# Patient Record
Sex: Female | Born: 1970 | Race: White | Hispanic: No | Marital: Single | Smoking: Never smoker
Health system: Southern US, Community
[De-identification: ages and names within clinical notes are randomized; demographics above are authoritative.]

## PROBLEM LIST (undated history)

## (undated) DIAGNOSIS — R112 Nausea with vomiting, unspecified: Secondary | ICD-10-CM

## (undated) DIAGNOSIS — Z9889 Other specified postprocedural states: Secondary | ICD-10-CM

## (undated) DIAGNOSIS — R519 Headache, unspecified: Secondary | ICD-10-CM

## (undated) DIAGNOSIS — T8859XA Other complications of anesthesia, initial encounter: Secondary | ICD-10-CM

## (undated) DIAGNOSIS — F410 Panic disorder [episodic paroxysmal anxiety] without agoraphobia: Secondary | ICD-10-CM

## (undated) DIAGNOSIS — F419 Anxiety disorder, unspecified: Secondary | ICD-10-CM

## (undated) DIAGNOSIS — T4145XA Adverse effect of unspecified anesthetic, initial encounter: Secondary | ICD-10-CM

## (undated) DIAGNOSIS — K219 Gastro-esophageal reflux disease without esophagitis: Secondary | ICD-10-CM

## (undated) DIAGNOSIS — D649 Anemia, unspecified: Secondary | ICD-10-CM

## (undated) DIAGNOSIS — R51 Headache: Secondary | ICD-10-CM

## (undated) HISTORY — PX: RIGHT OOPHORECTOMY: SHX2359

## (undated) HISTORY — DX: Anxiety disorder, unspecified: F41.9

## (undated) HISTORY — DX: Panic disorder (episodic paroxysmal anxiety): F41.0

## (undated) HISTORY — DX: Anemia, unspecified: D64.9

---

## 1997-12-23 ENCOUNTER — Encounter: Admission: RE | Admit: 1997-12-23 | Discharge: 1998-03-23 | Payer: Self-pay | Admitting: Obstetrics and Gynecology

## 2002-10-08 ENCOUNTER — Encounter: Payer: Self-pay | Admitting: Obstetrics and Gynecology

## 2002-10-08 ENCOUNTER — Ambulatory Visit (HOSPITAL_COMMUNITY): Admission: RE | Admit: 2002-10-08 | Discharge: 2002-10-08 | Payer: Self-pay | Admitting: Obstetrics and Gynecology

## 2003-02-05 ENCOUNTER — Encounter (INDEPENDENT_AMBULATORY_CARE_PROVIDER_SITE_OTHER): Payer: Self-pay

## 2003-02-05 ENCOUNTER — Inpatient Hospital Stay (HOSPITAL_COMMUNITY): Admission: RE | Admit: 2003-02-05 | Discharge: 2003-02-08 | Payer: Self-pay | Admitting: Obstetrics and Gynecology

## 2003-04-15 ENCOUNTER — Ambulatory Visit (HOSPITAL_COMMUNITY): Admission: RE | Admit: 2003-04-15 | Discharge: 2003-04-15 | Payer: Self-pay | Admitting: Obstetrics and Gynecology

## 2003-04-15 ENCOUNTER — Encounter: Payer: Self-pay | Admitting: Obstetrics and Gynecology

## 2007-12-04 ENCOUNTER — Emergency Department: Payer: Self-pay | Admitting: Emergency Medicine

## 2007-12-16 ENCOUNTER — Emergency Department: Payer: Self-pay | Admitting: Emergency Medicine

## 2007-12-16 ENCOUNTER — Other Ambulatory Visit: Payer: Self-pay

## 2008-08-18 ENCOUNTER — Emergency Department: Payer: Self-pay | Admitting: Emergency Medicine

## 2008-11-04 ENCOUNTER — Emergency Department: Payer: Self-pay | Admitting: Emergency Medicine

## 2008-12-12 ENCOUNTER — Emergency Department: Payer: Self-pay | Admitting: Emergency Medicine

## 2009-04-24 ENCOUNTER — Emergency Department: Payer: Self-pay | Admitting: Internal Medicine

## 2010-09-15 ENCOUNTER — Observation Stay: Payer: Self-pay | Admitting: Cardiovascular Disease

## 2010-09-18 ENCOUNTER — Ambulatory Visit: Payer: Self-pay | Admitting: Cardiovascular Disease

## 2010-09-22 ENCOUNTER — Ambulatory Visit: Payer: Self-pay | Admitting: Emergency Medicine

## 2010-09-29 ENCOUNTER — Ambulatory Visit: Payer: Self-pay | Admitting: Oncology

## 2010-10-01 ENCOUNTER — Other Ambulatory Visit: Payer: Self-pay | Admitting: Diagnostic Radiology

## 2010-10-01 ENCOUNTER — Ambulatory Visit: Payer: Self-pay | Admitting: Emergency Medicine

## 2010-10-06 ENCOUNTER — Encounter (HOSPITAL_COMMUNITY)
Admission: RE | Admit: 2010-10-06 | Discharge: 2010-11-20 | Payer: Self-pay | Source: Home / Self Care | Attending: Oncology | Admitting: Oncology

## 2010-10-06 LAB — CBC WITH DIFFERENTIAL/PLATELET
BASO%: 0.5 % (ref 0.0–2.0)
Basophils Absolute: 0 10*3/uL (ref 0.0–0.1)
EOS%: 4.6 % (ref 0.0–7.0)
Eosinophils Absolute: 0.2 10*3/uL (ref 0.0–0.5)
HCT: 21.7 % — ABNORMAL LOW (ref 34.8–46.6)
HGB: 7.6 g/dL — ABNORMAL LOW (ref 11.6–15.9)
LYMPH%: 33.5 % (ref 14.0–49.7)
MCH: 40 pg — ABNORMAL HIGH (ref 25.1–34.0)
MCHC: 35 g/dL (ref 31.5–36.0)
MCV: 114.4 fL — ABNORMAL HIGH (ref 79.5–101.0)
MONO#: 0.1 10*3/uL (ref 0.1–0.9)
MONO%: 2.6 % (ref 0.0–14.0)
NEUT#: 2.5 10*3/uL (ref 1.5–6.5)
NEUT%: 58.8 % (ref 38.4–76.8)
Platelets: 233 10*3/uL (ref 145–400)
RBC: 1.9 10*6/uL — ABNORMAL LOW (ref 3.70–5.45)
RDW: 28.7 % — ABNORMAL HIGH (ref 11.2–14.5)
WBC: 4.2 10*3/uL (ref 3.9–10.3)
lymph#: 1.4 10*3/uL (ref 0.9–3.3)

## 2010-10-06 LAB — CHCC SMEAR

## 2010-10-06 LAB — TYPE & CROSSMATCH - CHCC

## 2010-10-07 LAB — DIRECT ANTIGLOBULIN TEST (NOT AT ARMC)
DAT (Complement): NEGATIVE
DAT IgG: NEGATIVE

## 2010-10-08 ENCOUNTER — Ambulatory Visit: Payer: Self-pay | Admitting: Emergency Medicine

## 2010-10-09 LAB — COMPREHENSIVE METABOLIC PANEL
AST: 10 U/L (ref 0–37)
Alkaline Phosphatase: 57 U/L (ref 39–117)
BUN: 7 mg/dL (ref 6–23)
Creatinine, Ser: 0.54 mg/dL (ref 0.40–1.20)
Glucose, Bld: 93 mg/dL (ref 70–99)
Total Bilirubin: 1.2 mg/dL (ref 0.3–1.2)

## 2010-10-09 LAB — VITAMIN B12: Vitamin B-12: 46 pg/mL — ABNORMAL LOW (ref 211–911)

## 2010-10-09 LAB — SPEP & IFE WITH QIG
Albumin ELP: 57.3 % (ref 55.8–66.1)
Alpha-1-Globulin: 4.8 % (ref 2.9–4.9)
Alpha-2-Globulin: 6.8 % — ABNORMAL LOW (ref 7.1–11.8)
Beta Globulin: 5.5 % (ref 4.7–7.2)
IgA: 342 mg/dL (ref 68–378)
Total Protein, Serum Electrophoresis: 7 g/dL (ref 6.0–8.3)

## 2010-10-09 LAB — IRON AND TIBC
Iron: 147 ug/dL — ABNORMAL HIGH (ref 42–145)
TIBC: 270 ug/dL (ref 250–470)
UIBC: 123 ug/dL

## 2010-10-09 LAB — FOLATE: Folate: >20 ng/mL

## 2010-10-09 LAB — ERYTHROPOIETIN: Erythropoietin: 83.9 m[IU]/mL — ABNORMAL HIGH (ref 2.6–34.0)

## 2010-10-23 LAB — CBC WITH DIFFERENTIAL/PLATELET
BASO%: 0.5 % (ref 0.0–2.0)
Basophils Absolute: 0 10*3/uL (ref 0.0–0.1)
EOS%: 5.9 % (ref 0.0–7.0)
Eosinophils Absolute: 0.3 10*3/uL (ref 0.0–0.5)
HCT: 30.9 % — ABNORMAL LOW (ref 34.8–46.6)
HGB: 10.5 g/dL — ABNORMAL LOW (ref 11.6–15.9)
LYMPH%: 31.6 % (ref 14.0–49.7)
MCH: 35.4 pg — ABNORMAL HIGH (ref 25.1–34.0)
MCHC: 34.1 g/dL (ref 31.5–36.0)
MCV: 103.7 fL — ABNORMAL HIGH (ref 79.5–101.0)
MONO#: 0.5 10*3/uL (ref 0.1–0.9)
MONO%: 8.5 % (ref 0.0–14.0)
NEUT#: 2.9 10*3/uL (ref 1.5–6.5)
NEUT%: 53.5 % (ref 38.4–76.8)
Platelets: 340 10*3/uL (ref 145–400)
RBC: 2.98 10*6/uL — ABNORMAL LOW (ref 3.70–5.45)
RDW: 25.1 % — ABNORMAL HIGH (ref 11.2–14.5)
WBC: 5.3 10*3/uL (ref 3.9–10.3)
lymph#: 1.7 10*3/uL (ref 0.9–3.3)

## 2010-10-26 ENCOUNTER — Ambulatory Visit (HOSPITAL_COMMUNITY)
Admission: RE | Admit: 2010-10-26 | Discharge: 2010-10-26 | Payer: Self-pay | Source: Home / Self Care | Admitting: Oncology

## 2010-11-09 ENCOUNTER — Ambulatory Visit: Payer: Self-pay | Admitting: Oncology

## 2010-11-10 LAB — CBC WITH DIFFERENTIAL/PLATELET
BASO%: 2.1 % — ABNORMAL HIGH (ref 0.0–2.0)
Basophils Absolute: 0.1 10*3/uL (ref 0.0–0.1)
EOS%: 3.6 % (ref 0.0–7.0)
Eosinophils Absolute: 0.1 10*3/uL (ref 0.0–0.5)
HCT: 33.8 % — ABNORMAL LOW (ref 34.8–46.6)
HGB: 11.9 g/dL (ref 11.6–15.9)
LYMPH%: 24.7 % (ref 14.0–49.7)
MCH: 33.9 pg (ref 25.1–34.0)
MCHC: 35.1 g/dL (ref 31.5–36.0)
MCV: 96.6 fL (ref 79.5–101.0)
MONO#: 0.3 10*3/uL (ref 0.1–0.9)
MONO%: 8.7 % (ref 0.0–14.0)
NEUT#: 2.4 10*3/uL (ref 1.5–6.5)
NEUT%: 60.9 % (ref 38.4–76.8)
Platelets: 211 10*3/uL (ref 145–400)
RBC: 3.5 10*6/uL — ABNORMAL LOW (ref 3.70–5.45)
RDW: 14.9 % — ABNORMAL HIGH (ref 11.2–14.5)
WBC: 3.9 10*3/uL (ref 3.9–10.3)
lymph#: 1 10*3/uL (ref 0.9–3.3)

## 2010-12-08 LAB — CBC WITH DIFFERENTIAL/PLATELET
BASO%: 1.7 % (ref 0.0–2.0)
Basophils Absolute: 0.1 10*3/uL (ref 0.0–0.1)
EOS%: 2.3 % (ref 0.0–7.0)
Eosinophils Absolute: 0.1 10*3/uL (ref 0.0–0.5)
HCT: 39.7 % (ref 34.8–46.6)
HGB: 13.5 g/dL (ref 11.6–15.9)
LYMPH%: 26.3 % (ref 14.0–49.7)
MCH: 30.9 pg (ref 25.1–34.0)
MCHC: 34 g/dL (ref 31.5–36.0)
MCV: 90.7 fL (ref 79.5–101.0)
MONO#: 0.4 10*3/uL (ref 0.1–0.9)
MONO%: 8 % (ref 0.0–14.0)
NEUT#: 3.4 10*3/uL (ref 1.5–6.5)
NEUT%: 61.7 % (ref 38.4–76.8)
Platelets: 267 10*3/uL (ref 145–400)
RBC: 4.37 10*6/uL (ref 3.70–5.45)
RDW: 12.6 % (ref 11.2–14.5)
WBC: 5.5 10*3/uL (ref 3.9–10.3)
lymph#: 1.4 10*3/uL (ref 0.9–3.3)

## 2011-01-05 ENCOUNTER — Encounter (HOSPITAL_BASED_OUTPATIENT_CLINIC_OR_DEPARTMENT_OTHER): Payer: Medicaid Other | Admitting: Oncology

## 2011-01-05 ENCOUNTER — Other Ambulatory Visit: Payer: Self-pay | Admitting: Oncology

## 2011-01-05 DIAGNOSIS — E119 Type 2 diabetes mellitus without complications: Secondary | ICD-10-CM

## 2011-01-05 DIAGNOSIS — D649 Anemia, unspecified: Secondary | ICD-10-CM

## 2011-01-05 DIAGNOSIS — E538 Deficiency of other specified B group vitamins: Secondary | ICD-10-CM

## 2011-01-05 DIAGNOSIS — I10 Essential (primary) hypertension: Secondary | ICD-10-CM

## 2011-01-05 DIAGNOSIS — I2510 Atherosclerotic heart disease of native coronary artery without angina pectoris: Secondary | ICD-10-CM

## 2011-01-05 LAB — COMPREHENSIVE METABOLIC PANEL
ALT: 8 U/L (ref 0–35)
AST: 9 U/L (ref 0–37)
Albumin: 4.1 g/dL (ref 3.5–5.2)
Alkaline Phosphatase: 78 U/L (ref 39–117)
BUN: 8 mg/dL (ref 6–23)
CO2: 23 mEq/L (ref 19–32)
Calcium: 8.8 mg/dL (ref 8.4–10.5)
Chloride: 104 mEq/L (ref 96–112)
Creatinine, Ser: 0.55 mg/dL (ref 0.40–1.20)
Glucose, Bld: 84 mg/dL (ref 70–99)
Potassium: 4 mEq/L (ref 3.5–5.3)
Sodium: 137 mEq/L (ref 135–145)
Total Bilirubin: 0.5 mg/dL (ref 0.3–1.2)
Total Protein: 7.2 g/dL (ref 6.0–8.3)

## 2011-01-05 LAB — CBC WITH DIFFERENTIAL/PLATELET
BASO%: 0.8 % (ref 0.0–2.0)
Basophils Absolute: 0 10*3/uL (ref 0.0–0.1)
EOS%: 2.8 % (ref 0.0–7.0)
Eosinophils Absolute: 0.2 10*3/uL (ref 0.0–0.5)
HCT: 37.3 % (ref 34.8–46.6)
HGB: 12.4 g/dL (ref 11.6–15.9)
LYMPH%: 21.9 % (ref 14.0–49.7)
MCH: 29.2 pg (ref 25.1–34.0)
MCHC: 33.4 g/dL (ref 31.5–36.0)
MCV: 87.4 fL (ref 79.5–101.0)
MONO#: 0.3 10*3/uL (ref 0.1–0.9)
MONO%: 6.5 % (ref 0.0–14.0)
NEUT#: 3.7 10*3/uL (ref 1.5–6.5)
NEUT%: 68 % (ref 38.4–76.8)
Platelets: 281 10*3/uL (ref 145–400)
RBC: 4.26 10*6/uL (ref 3.70–5.45)
RDW: 11.9 % (ref 11.2–14.5)
WBC: 5.4 10*3/uL (ref 3.9–10.3)
lymph#: 1.2 10*3/uL (ref 0.9–3.3)

## 2011-01-05 LAB — VITAMIN B12: Vitamin B-12: 225 pg/mL (ref 211–911)

## 2011-02-02 LAB — CROSSMATCH
Antibody Screen: NEGATIVE
Unit division: 0

## 2011-02-02 LAB — CBC
HCT: 33.6 % — ABNORMAL LOW (ref 36.0–46.0)
Hemoglobin: 11.6 g/dL — ABNORMAL LOW (ref 12.0–15.0)
MCV: 100.3 fL — ABNORMAL HIGH (ref 78.0–100.0)
Platelets: 317 10*3/uL (ref 150–400)
RBC: 3.35 MIL/uL — ABNORMAL LOW (ref 3.87–5.11)
WBC: 4.2 10*3/uL (ref 4.0–10.5)

## 2011-02-02 LAB — DIFFERENTIAL
Eosinophils Relative: 8 % — ABNORMAL HIGH (ref 0–5)
Lymphocytes Relative: 31 % (ref 12–46)
Lymphs Abs: 1.3 10*3/uL (ref 0.7–4.0)
Monocytes Relative: 13 % — ABNORMAL HIGH (ref 3–12)

## 2011-02-02 LAB — BONE MARROW EXAM

## 2011-02-03 ENCOUNTER — Other Ambulatory Visit: Payer: Self-pay | Admitting: Oncology

## 2011-02-03 ENCOUNTER — Encounter (HOSPITAL_BASED_OUTPATIENT_CLINIC_OR_DEPARTMENT_OTHER): Payer: Medicaid Other | Admitting: Oncology

## 2011-02-03 DIAGNOSIS — I10 Essential (primary) hypertension: Secondary | ICD-10-CM

## 2011-02-03 DIAGNOSIS — E538 Deficiency of other specified B group vitamins: Secondary | ICD-10-CM

## 2011-02-03 DIAGNOSIS — D649 Anemia, unspecified: Secondary | ICD-10-CM

## 2011-02-03 DIAGNOSIS — D539 Nutritional anemia, unspecified: Secondary | ICD-10-CM

## 2011-02-03 DIAGNOSIS — I2510 Atherosclerotic heart disease of native coronary artery without angina pectoris: Secondary | ICD-10-CM

## 2011-02-03 DIAGNOSIS — E119 Type 2 diabetes mellitus without complications: Secondary | ICD-10-CM

## 2011-02-03 LAB — CBC WITH DIFFERENTIAL/PLATELET
BASO%: 1 % (ref 0.0–2.0)
EOS%: 0.5 % (ref 0.0–7.0)
LYMPH%: 18.6 % (ref 14.0–49.7)
MCHC: 34 g/dL (ref 31.5–36.0)
MCV: 83.4 fL (ref 79.5–101.0)
MONO%: 6.6 % (ref 0.0–14.0)
Platelets: 364 10*3/uL (ref 145–400)
RBC: 4.06 10*6/uL (ref 3.70–5.45)
WBC: 4.9 10*3/uL (ref 3.9–10.3)

## 2011-02-03 LAB — COMPREHENSIVE METABOLIC PANEL
Albumin: 3.4 g/dL — ABNORMAL LOW (ref 3.5–5.2)
Alkaline Phosphatase: 74 U/L (ref 39–117)
Calcium: 8.8 mg/dL (ref 8.4–10.5)
Sodium: 138 mEq/L (ref 135–145)
Total Bilirubin: 0.6 mg/dL (ref 0.3–1.2)

## 2011-03-02 ENCOUNTER — Encounter (HOSPITAL_BASED_OUTPATIENT_CLINIC_OR_DEPARTMENT_OTHER): Payer: Medicaid Other | Admitting: Oncology

## 2011-03-02 DIAGNOSIS — E538 Deficiency of other specified B group vitamins: Secondary | ICD-10-CM

## 2011-03-30 ENCOUNTER — Other Ambulatory Visit: Payer: Self-pay | Admitting: Medical

## 2011-03-30 ENCOUNTER — Encounter (HOSPITAL_BASED_OUTPATIENT_CLINIC_OR_DEPARTMENT_OTHER): Payer: Medicaid Other | Admitting: Oncology

## 2011-03-30 DIAGNOSIS — I10 Essential (primary) hypertension: Secondary | ICD-10-CM

## 2011-03-30 DIAGNOSIS — I2510 Atherosclerotic heart disease of native coronary artery without angina pectoris: Secondary | ICD-10-CM

## 2011-03-30 DIAGNOSIS — D539 Nutritional anemia, unspecified: Secondary | ICD-10-CM

## 2011-03-30 DIAGNOSIS — D649 Anemia, unspecified: Secondary | ICD-10-CM

## 2011-03-30 DIAGNOSIS — E538 Deficiency of other specified B group vitamins: Secondary | ICD-10-CM

## 2011-03-30 LAB — COMPREHENSIVE METABOLIC PANEL
ALT: 21 U/L (ref 0–35)
AST: 23 U/L (ref 0–37)
Albumin: 4.2 g/dL (ref 3.5–5.2)
BUN: 10 mg/dL (ref 6–23)
CO2: 21 mEq/L (ref 19–32)
Calcium: 8.8 mg/dL (ref 8.4–10.5)
Chloride: 105 mEq/L (ref 96–112)
Potassium: 3.7 mEq/L (ref 3.5–5.3)

## 2011-03-30 LAB — CBC WITH DIFFERENTIAL/PLATELET
BASO%: 0.7 % (ref 0.0–2.0)
Basophils Absolute: 0 10*3/uL (ref 0.0–0.1)
EOS%: 3 % (ref 0.0–7.0)
HCT: 34.1 % — ABNORMAL LOW (ref 34.8–46.6)
HGB: 11.2 g/dL — ABNORMAL LOW (ref 11.6–15.9)
MCH: 27 pg (ref 25.1–34.0)
MONO#: 0.3 10*3/uL (ref 0.1–0.9)
NEUT#: 3 10*3/uL (ref 1.5–6.5)
RDW: 12.7 % (ref 11.2–14.5)
WBC: 5 10*3/uL (ref 3.9–10.3)
lymph#: 1.4 10*3/uL (ref 0.9–3.3)

## 2011-04-09 NOTE — H&P (Signed)
Jenny Griffin, Jenny Griffin NO.:  [id number]   MEDICAL RECORD NO.:  [id number]                   PATIENT TYPE:   LOCATION:                                       FACILITY:   PHYSICIAN:  Duke Salvia. Marcelle Overlie, M.D.            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   HISTORY OF PRESENT ILLNESS:  A 40 year old G3, P3 who has had cesarean  section x3.  At the time of her last section in 1998 was noted that both  ovaries appeared to be normal.  Prior to that in 1996 I performed a  laparoscopy for pelvic pain and dyspareunia.  She had solitary anterior  abdominal wall omental adhesion, but no other abnormalities.  She has moved  away until recently and was taken care of in [place] at the [facility] where she underwent several procedures in the last two years.   November 2002 she had diagnostic laparoscopy that was converted to a  laparotomy for right ovarian cystectomy and her uterus was noted to be  slightly enlarged symmetrically, but no other abnormalities.   February 2003, approximately a year ago, she had laparotomy through a  transverse incision for lysis of omental adhesions, left ovarian cystectomy  that turned out to be a corpus luteum cyst and there were some adherence of  the right ovary with a completely blocked right tube at that time.  Of note  that she did experience postoperative pulmonary edema that required  diuretics and ICU admission overnight with resolution.   She has continued to have a problematic severe right lower quadrant pain  that has required narcotics and presents at this time for laparoscopy with  RSO.  She does not want to try to conserve the right side since that has  been constantly where her pain has been.  This procedure including risks of  bleeding,  infection, adjacent organ injury, transfusion, possible need to  complete the surgery abdominally all discussed with her.   Recent mid luteal progesterone was 18.7.  Semen analysis showed 110 million  count with 79% motility and normal forms.  She had HSG done October 08, 2002 that showed a moderate amount of pressure required for __________  but  there was spillage on both sides, but no migration of the dye around either  tube suggesting the possibility of periadnexal adhesions.   ALLERGIES:  None.   OPERATIONS:  1. Hand surgery in 1990 and 1995.  2. She has had three prior cesarean sections.  3. Laparotomy x2.  4. Laparoscopy x1.   CURRENT MEDICATIONS:  Pain medicines, otherwise none.   PHYSICAL EXAMINATION:  VITAL SIGNS:  Temperature 98.2, blood pressure  120/78.  HEENT:  Unremarkable.  NECK:  Supple without masses.  LUNGS:  Clear.  CARDIOVASCULAR:  Regular rate and rhythm without murmurs, rubs, gallops  noted.  BREASTS:  Without masses or nipple discharge.  ABDOMEN:  Soft, flat, nontender.  PELVIC:  Normal external genitalia.  Vagina, cervix clear.  Uterus mid  position, upper limits normal size.  Adnexa slightly tender on the right,  but no masses noted.   IMPRESSION:  Chronic right lower quadrant pain, history of two laparotomies  within one year at another hospital for ovarian cysts.  The strong  possibility of adnexal adhesions suggested by those notes and by her history  reviewed with her.   PLAN:  Diagnostic laparoscopy with RSO, possible laparotomy with RSO.  Procedure and risks reviewed as above.                                               Richard M. Marcelle Overlie, M.D.    RMH/MEDQ  D:  01/30/2003  T:  01/30/2003  Job:  [id number]

## 2011-04-09 NOTE — Discharge Summary (Signed)
   NAME:  Jenny Griffin, Jenny Griffin                        ACCOUNT NO.:  [id number]   MEDICAL RECORD NO.:  [id number]                   PATIENT TYPE:  INP   LOCATION:  9303                                 FACILITY:  [facility]   PHYSICIAN:  Duke Salvia. Marcelle Overlie, M.D.            DATE OF BIRTH:  [birth year]   DATE OF ADMISSION:  02/05/2003  DATE OF DISCHARGE:  02/08/2003                                 DISCHARGE SUMMARY   DISCHARGE DIAGNOSES:  1. Chronic pelvic pain.  2. Laparoscopy followed by laparotomy for lysis of adhesions.  3. Right salpingo-oophorectomy secondary to adhesions.   HOSPITAL COURSE:  For summary of the admission history and physical exam,  please see admission H&P for details.  Briefly, a 40 year old who has had  recurrent pelvic pain, has had three prior sections and also in the last two  years has had two prior laparotomies for ovarian cysts where adnexal  adhesions were described.  Presents now for definitive RSO with lysis of  adhesions.   On February 05, 2003 under general anesthesia, the patient underwent  laparoscopy followed by laparotomy, RSO, and lysis of adhesions.  The  remaining left tube and ovary did show some adhesions which were lysed in an  effort to improve her fertility.  On postoperative day #1 she was  complaining of nausea and required IV nausea medicines.  Her hemoglobin was  10.2.  Clear liquids were being marginally tolerated at that point.  On  March 18 she was still complaining of nausea that required some antiemetics.  Her diet was slowly advanced and by March 19 she was tolerating a regular  diet, was ready for discharge at that point.  The incision was clean and  dry.  She was ambulating without difficulty and afebrile.   LABORATORY DATA:  Preoperative hemoglobin 11.9, postoperative on March 17  was 10.2.  Admission UA was negative.  Pathology report is still pending.   DISPOSITION:  1. The patient discharged on:     a. Tylox p.r.n. pain.     b.  Phenergan 25 mg tablets one p.o. q.4h. p.r.n. nausea.  2. Will return to the office in three days to have the clips removed and     advised to report any incisional redness or drainage, increased pain or     bleeding, or fever over 101.  3. She was given other specific instructions regarding diet, sex, exercise.   CONDITION:  Good.   ACTIVITY:  Gradually increase.                                               Richard M. Marcelle Overlie, M.D.    RMH/MEDQ  D:  02/08/2003  T:  02/08/2003  Job:  [id number]

## 2011-04-09 NOTE — Op Note (Signed)
NAME:  Jenny Griffin, Jenny Griffin                        ACCOUNT NO.:  [id number]   MEDICAL RECORD NO.:  [id number]                   PATIENT TYPE:  AMB   LOCATION:  SDC                                  [facility]ACILITY:  [facility]   PHYSICIAN:  Duke Salvia. Marcelle Overlie, M.D.            DATE OF BIRTH:  [birth year]   DATE OF PROCEDURE:  02/05/2003  DATE OF DISCHARGE:                                 OPERATIVE REPORT   PREOPERATIVE DIAGNOSIS:  Chronic right lower quadrant pain.   POSTOPERATIVE DIAGNOSES:  1. Chronic right lower quadrant pain.  2. Bilateral adnexal adhesions and omental adhesions.   PROCEDURES:  1. Diagnostic laparoscopy.  2. Laparotomy with right salpingo-oophorectomy, lysis of adhesions.   SURGEON:  Duke Salvia. Marcelle Overlie, M.D.   ANESTHESIA:  General endotracheal.   COMPLICATIONS:  None.   DRAINS:  Foley catheter.   ESTIMATED BLOOD LOSS:  150.   SPECIMENS:  Right tube and ovary.   FINDINGS AND PROCEDURE:  The patient to the operating room after an adequate  level of general endotracheal anesthesia was obtained with the patient's  legs in stirrups.  The abdomen, perineum, and vagina were prepped and draped  in the usual manner for a laparoscopy.  The bladder was drained and EUA  carried out.  The uterus was upper limit of normal size, symmetric, mobile,  adnexa negative.  The Hulka tenaculum was positioned.  Attention directed to  the abdomen, where a 2 cm subumbilical incision was made.  The Veress needle  was introduced without difficulty.  Its intra-abdominal position was  verified by pressure and water testing.  After 2.5 L pneumoperitoneum was  then created, laparoscopic trocar and sleeve were then introduced without  difficulty.  There was no evidence of any bleeding or trauma.  Three  fingerbreadths above the symphysis in the midline, a 5 mm trocar was  inserted under direct visualization and a blunt probe was inserted.  The  patient had been placed in Trendelenburg and the findings  as follows:   There were some omental adhesions into the right lower quadrant into the  area of the right tube and ovary.  I could see around this, but the right  tube and ovary were densely adherent to the right pelvic sidewall.  The  cecum and appendix appeared to be normal.  There were also some peri-adnexal  adhesions from the left ovary to the colon with some kinking in the distal  part of the left tube.  The uterus itself was six weeks' size, no obvious  fibroids but some minimal enlargement possibly secondary to adenomyosis.  The posterior cul-de-sac was clear.  There was some significant bladder flap  scarring from her prior cesarean.  After this was noted, a decision made to  proceed with laparotomy.  The scope was removed.  The upper incision was  closed with subcuticular 4-0 Dexon sutures.  The patient's legs were placed  supine, Foley catheter  positioned.  A Pfannenstiel incision made over the  old scar, which was carried down to the fascia and extended transversely,  the rectus muscles divided in the midline and the peritoneum entered  superiorly without incident and extended vertically.  An O'Connor-O'Sullivan  retractor was positioned, the bowels were packed superiorly out of the  field.  The omental adhesions were lysed at their attachment to the anterior  abdominal wall in an avascular plane.  These areas were hemostatic, which  allowed better visualization of the right tube and ovary, which were  adherent to the right pelvic sidewall.  The round ligament on the right was  clamped and divided.  The retroperitoneal space on that side was developed.  The course of the ureter was well below.  The ovary was freed up from its  attachment to the right pelvic sidewall, and the right IP ligament was  developed.  This was tied x2 with 0 Dexon ties after assuring that the  ureter was well below.  This was divided.  The remainder of the tubo-ovarian  pedicle was then clamped and  suture ligated with 0 Dexon suture.  This was  irrigated and noted to be hemostatic.  Again the course of the ureter was  well below the operative site on the left.  The fimbriated end of the tube  looked relatively normal, but there was some convolution at the distal one-  third.  The adhesions between one pole of the left ovary and the cecum were  freed up with Metzenbaum scissors.  This was hemostatic.  The omental  adhesions in the right lower quadrant were freed up with sharp dissection in  an avascular plane.  The appendix and cecum were otherwise normal.  Prior to  closure, sponge, needle, and instruments were reported as correct x2. The  rectus muscles reapproximated with 2-0 Dexon interrupted sutures, fascia  closed from laterally to midline on either side with a 0 PDS suture.  The  subcutaneous fat was hemostatic.  Clips and Steri-Strips used on the skin.  Pressure dressing applied.  She tolerated this well and went to the recovery  room in good condition.                                               Richard M. Marcelle Overlie, M.D.    RMH/MEDQ  D:  02/05/2003  T:  02/05/2003  Job:  [id number]

## 2011-04-20 ENCOUNTER — Encounter (HOSPITAL_BASED_OUTPATIENT_CLINIC_OR_DEPARTMENT_OTHER): Payer: Medicaid Other | Admitting: Oncology

## 2011-04-20 DIAGNOSIS — E538 Deficiency of other specified B group vitamins: Secondary | ICD-10-CM

## 2011-05-11 ENCOUNTER — Encounter (HOSPITAL_BASED_OUTPATIENT_CLINIC_OR_DEPARTMENT_OTHER): Payer: Medicaid Other | Admitting: Oncology

## 2011-05-11 DIAGNOSIS — E538 Deficiency of other specified B group vitamins: Secondary | ICD-10-CM

## 2011-05-11 DIAGNOSIS — D539 Nutritional anemia, unspecified: Secondary | ICD-10-CM

## 2011-06-01 ENCOUNTER — Other Ambulatory Visit: Payer: Self-pay | Admitting: Oncology

## 2011-06-01 ENCOUNTER — Encounter (HOSPITAL_BASED_OUTPATIENT_CLINIC_OR_DEPARTMENT_OTHER): Payer: Medicaid Other | Admitting: Oncology

## 2011-06-01 DIAGNOSIS — I10 Essential (primary) hypertension: Secondary | ICD-10-CM

## 2011-06-01 DIAGNOSIS — E538 Deficiency of other specified B group vitamins: Secondary | ICD-10-CM

## 2011-06-01 DIAGNOSIS — D649 Anemia, unspecified: Secondary | ICD-10-CM

## 2011-06-01 DIAGNOSIS — D539 Nutritional anemia, unspecified: Secondary | ICD-10-CM

## 2011-06-01 LAB — CBC WITH DIFFERENTIAL/PLATELET
BASO%: 1.5 % (ref 0.0–2.0)
EOS%: 4.6 % (ref 0.0–7.0)
MCH: 24.5 pg — ABNORMAL LOW (ref 25.1–34.0)
MCHC: 32.6 g/dL (ref 31.5–36.0)
RDW: 14 % (ref 11.2–14.5)
WBC: 5.1 10*3/uL (ref 3.9–10.3)
lymph#: 1.5 10*3/uL (ref 0.9–3.3)

## 2011-06-01 LAB — FERRITIN: Ferritin: 6 ng/mL — ABNORMAL LOW (ref 10–291)

## 2011-06-01 LAB — VITAMIN B12: Vitamin B-12: 405 pg/mL (ref 211–911)

## 2011-06-01 LAB — IRON AND TIBC: Iron: 26 ug/dL — ABNORMAL LOW (ref 42–145)

## 2011-06-07 ENCOUNTER — Encounter (HOSPITAL_BASED_OUTPATIENT_CLINIC_OR_DEPARTMENT_OTHER): Payer: Medicaid Other | Admitting: Oncology

## 2011-06-07 DIAGNOSIS — D509 Iron deficiency anemia, unspecified: Secondary | ICD-10-CM

## 2011-06-14 ENCOUNTER — Encounter (HOSPITAL_BASED_OUTPATIENT_CLINIC_OR_DEPARTMENT_OTHER): Payer: Medicaid Other | Admitting: Oncology

## 2011-06-14 DIAGNOSIS — D539 Nutritional anemia, unspecified: Secondary | ICD-10-CM

## 2011-06-14 DIAGNOSIS — E538 Deficiency of other specified B group vitamins: Secondary | ICD-10-CM

## 2011-06-22 ENCOUNTER — Encounter (HOSPITAL_BASED_OUTPATIENT_CLINIC_OR_DEPARTMENT_OTHER): Payer: Medicaid Other | Admitting: Oncology

## 2011-06-22 DIAGNOSIS — E538 Deficiency of other specified B group vitamins: Secondary | ICD-10-CM

## 2011-07-13 ENCOUNTER — Encounter (HOSPITAL_BASED_OUTPATIENT_CLINIC_OR_DEPARTMENT_OTHER): Payer: Medicaid Other | Admitting: Oncology

## 2011-07-13 ENCOUNTER — Other Ambulatory Visit: Payer: Self-pay | Admitting: Medical

## 2011-07-13 DIAGNOSIS — D509 Iron deficiency anemia, unspecified: Secondary | ICD-10-CM

## 2011-07-13 DIAGNOSIS — D539 Nutritional anemia, unspecified: Secondary | ICD-10-CM

## 2011-07-13 DIAGNOSIS — E538 Deficiency of other specified B group vitamins: Secondary | ICD-10-CM

## 2011-07-13 DIAGNOSIS — D649 Anemia, unspecified: Secondary | ICD-10-CM

## 2011-07-13 LAB — COMPREHENSIVE METABOLIC PANEL
ALT: 11 U/L (ref 0–35)
CO2: 23 mEq/L (ref 19–32)
Creatinine, Ser: 0.6 mg/dL (ref 0.50–1.10)
Total Bilirubin: 0.3 mg/dL (ref 0.3–1.2)

## 2011-07-13 LAB — CBC WITH DIFFERENTIAL/PLATELET
BASO%: 1.3 % (ref 0.0–2.0)
EOS%: 3.7 % (ref 0.0–7.0)
HCT: 37.4 % (ref 34.8–46.6)
LYMPH%: 25.6 % (ref 14.0–49.7)
MCH: 28.4 pg (ref 25.1–34.0)
MCHC: 34.1 g/dL (ref 31.5–36.0)
MCV: 83.2 fL (ref 79.5–101.0)
MONO#: 0.4 10*3/uL (ref 0.1–0.9)
NEUT%: 62.1 % (ref 38.4–76.8)
Platelets: 231 10*3/uL (ref 145–400)

## 2011-08-04 ENCOUNTER — Encounter (HOSPITAL_BASED_OUTPATIENT_CLINIC_OR_DEPARTMENT_OTHER): Payer: Medicaid Other | Admitting: Oncology

## 2011-08-04 DIAGNOSIS — E538 Deficiency of other specified B group vitamins: Secondary | ICD-10-CM

## 2011-08-24 ENCOUNTER — Other Ambulatory Visit: Payer: Self-pay | Admitting: Oncology

## 2011-08-24 ENCOUNTER — Encounter (HOSPITAL_BASED_OUTPATIENT_CLINIC_OR_DEPARTMENT_OTHER): Payer: Self-pay | Admitting: Oncology

## 2011-08-24 DIAGNOSIS — R5381 Other malaise: Secondary | ICD-10-CM

## 2011-08-24 DIAGNOSIS — D509 Iron deficiency anemia, unspecified: Secondary | ICD-10-CM

## 2011-08-24 DIAGNOSIS — E538 Deficiency of other specified B group vitamins: Secondary | ICD-10-CM

## 2011-08-24 DIAGNOSIS — D539 Nutritional anemia, unspecified: Secondary | ICD-10-CM

## 2011-08-24 LAB — CBC WITH DIFFERENTIAL/PLATELET
Eosinophils Absolute: 0.1 10*3/uL (ref 0.0–0.5)
HCT: 40.1 % (ref 34.8–46.6)
LYMPH%: 21.3 % (ref 14.0–49.7)
MCHC: 35.2 g/dL (ref 31.5–36.0)
MONO#: 0.5 10*3/uL (ref 0.1–0.9)
NEUT%: 69.3 % (ref 38.4–76.8)
Platelets: 232 10*3/uL (ref 145–400)
WBC: 7.2 10*3/uL (ref 3.9–10.3)

## 2011-08-24 LAB — IRON AND TIBC
%SAT: 33 % (ref 20–55)
Iron: 89 ug/dL (ref 42–145)
UIBC: 181 ug/dL (ref 125–400)

## 2011-08-24 LAB — COMPREHENSIVE METABOLIC PANEL
BUN: 9 mg/dL (ref 6–23)
CO2: 21 mEq/L (ref 19–32)
Creatinine, Ser: 0.6 mg/dL (ref 0.50–1.10)
Glucose, Bld: 94 mg/dL (ref 70–99)
Total Bilirubin: 0.3 mg/dL (ref 0.3–1.2)

## 2011-08-24 LAB — FERRITIN: Ferritin: 76 ng/mL (ref 10–291)

## 2011-09-14 ENCOUNTER — Encounter (HOSPITAL_BASED_OUTPATIENT_CLINIC_OR_DEPARTMENT_OTHER): Payer: Self-pay | Admitting: Oncology

## 2011-09-14 DIAGNOSIS — E538 Deficiency of other specified B group vitamins: Secondary | ICD-10-CM

## 2011-09-16 ENCOUNTER — Encounter: Payer: Self-pay | Admitting: Oncology

## 2011-09-16 DIAGNOSIS — D649 Anemia, unspecified: Secondary | ICD-10-CM

## 2011-09-16 DIAGNOSIS — D510 Vitamin B12 deficiency anemia due to intrinsic factor deficiency: Secondary | ICD-10-CM

## 2011-10-03 ENCOUNTER — Other Ambulatory Visit: Payer: Self-pay | Admitting: Oncology

## 2011-10-05 ENCOUNTER — Ambulatory Visit (HOSPITAL_BASED_OUTPATIENT_CLINIC_OR_DEPARTMENT_OTHER): Payer: BC Managed Care – PPO

## 2011-10-05 VITALS — BP 117/69 | HR 91 | Temp 98.4°F

## 2011-10-05 DIAGNOSIS — E538 Deficiency of other specified B group vitamins: Secondary | ICD-10-CM

## 2011-10-05 DIAGNOSIS — D649 Anemia, unspecified: Secondary | ICD-10-CM

## 2011-10-05 MED ORDER — CYANOCOBALAMIN 1000 MCG/ML IJ SOLN
1000.0000 ug | Freq: Once | INTRAMUSCULAR | Status: AC
  Administered 2011-10-05: 1000 ug via INTRAMUSCULAR

## 2011-10-22 ENCOUNTER — Other Ambulatory Visit: Payer: Self-pay | Admitting: Nurse Practitioner

## 2011-10-26 ENCOUNTER — Ambulatory Visit (HOSPITAL_BASED_OUTPATIENT_CLINIC_OR_DEPARTMENT_OTHER): Payer: BC Managed Care – PPO

## 2011-10-26 VITALS — BP 121/85 | HR 80 | Temp 98.0°F

## 2011-10-26 DIAGNOSIS — E538 Deficiency of other specified B group vitamins: Secondary | ICD-10-CM

## 2011-10-26 DIAGNOSIS — D649 Anemia, unspecified: Secondary | ICD-10-CM

## 2011-10-26 MED ORDER — CYANOCOBALAMIN 1000 MCG/ML IJ SOLN
1000.0000 ug | Freq: Once | INTRAMUSCULAR | Status: AC
  Administered 2011-10-26: 1000 ug via INTRAMUSCULAR

## 2011-11-15 ENCOUNTER — Ambulatory Visit (HOSPITAL_BASED_OUTPATIENT_CLINIC_OR_DEPARTMENT_OTHER): Payer: BC Managed Care – PPO

## 2011-11-15 VITALS — BP 115/72 | HR 79 | Temp 98.5°F

## 2011-11-15 DIAGNOSIS — D649 Anemia, unspecified: Secondary | ICD-10-CM

## 2011-11-15 MED ORDER — CYANOCOBALAMIN 1000 MCG/ML IJ SOLN
1000.0000 ug | Freq: Once | INTRAMUSCULAR | Status: AC
  Administered 2011-11-15: 1000 ug via INTRAMUSCULAR

## 2011-11-17 ENCOUNTER — Other Ambulatory Visit: Payer: Self-pay | Admitting: Orthopaedic Surgery

## 2011-11-17 DIAGNOSIS — M542 Cervicalgia: Secondary | ICD-10-CM

## 2011-11-20 ENCOUNTER — Other Ambulatory Visit: Payer: BC Managed Care – PPO

## 2011-11-23 DIAGNOSIS — F410 Panic disorder [episodic paroxysmal anxiety] without agoraphobia: Secondary | ICD-10-CM

## 2011-11-23 HISTORY — DX: Panic disorder (episodic paroxysmal anxiety): F41.0

## 2011-11-24 ENCOUNTER — Inpatient Hospital Stay: Admission: RE | Admit: 2011-11-24 | Payer: BC Managed Care – PPO | Source: Ambulatory Visit

## 2011-12-13 ENCOUNTER — Telehealth: Payer: Self-pay | Admitting: Oncology

## 2011-12-13 NOTE — Telephone Encounter (Signed)
Pt called today re needing inj appt. Says she has not heard about appts. Pt was already on schedule for f/u but inj appts should continue q3w. injs added and pt will get new schedule when she comes in tomorrow.

## 2011-12-14 ENCOUNTER — Ambulatory Visit (HOSPITAL_BASED_OUTPATIENT_CLINIC_OR_DEPARTMENT_OTHER): Payer: BC Managed Care – PPO

## 2011-12-14 VITALS — BP 121/76 | HR 85 | Temp 98.3°F

## 2011-12-14 DIAGNOSIS — D649 Anemia, unspecified: Secondary | ICD-10-CM

## 2011-12-14 MED ORDER — CYANOCOBALAMIN 1000 MCG/ML IJ SOLN
1000.0000 ug | Freq: Once | INTRAMUSCULAR | Status: AC
  Administered 2011-12-14: 1000 ug via INTRAMUSCULAR

## 2012-01-04 ENCOUNTER — Ambulatory Visit (HOSPITAL_BASED_OUTPATIENT_CLINIC_OR_DEPARTMENT_OTHER): Payer: BC Managed Care – PPO

## 2012-01-04 ENCOUNTER — Telehealth: Payer: Self-pay | Admitting: *Deleted

## 2012-01-04 VITALS — BP 115/73 | HR 79 | Temp 98.6°F

## 2012-01-04 DIAGNOSIS — D649 Anemia, unspecified: Secondary | ICD-10-CM

## 2012-01-04 MED ORDER — CYANOCOBALAMIN 1000 MCG/ML IJ SOLN
1000.0000 ug | Freq: Once | INTRAMUSCULAR | Status: AC
  Administered 2012-01-04: 1000 ug via INTRAMUSCULAR

## 2012-01-04 NOTE — Telephone Encounter (Signed)
Pt called requesting to switch to Dr. Welton Flakes from Dr. Clelia Croft.  Both physicians gave approval for the switch.  Per Dr. Welton Flakes will see pt on 2/19.  Gave pt appt date and time.

## 2012-01-11 ENCOUNTER — Telehealth: Payer: Self-pay | Admitting: Oncology

## 2012-01-11 ENCOUNTER — Ambulatory Visit: Payer: BC Managed Care – PPO | Admitting: Oncology

## 2012-01-11 NOTE — Telephone Encounter (Signed)
Called pt @ [phone] (cell). lmonvm for pt re appt for 2/21 @ 3:30 pm. D/t per KK - transfer from KK to FS. Pt came for appt today but appt had been cx'd by scheduling as an error and new appt was never put on schedule. Pt provided me w/her cell and asked that i use this number to call her and lm. Per pt she would call me back to confirm if she couldn't answer call.

## 2012-01-12 ENCOUNTER — Other Ambulatory Visit: Payer: Self-pay | Admitting: *Deleted

## 2012-01-12 DIAGNOSIS — D649 Anemia, unspecified: Secondary | ICD-10-CM

## 2012-01-13 ENCOUNTER — Ambulatory Visit: Payer: BC Managed Care – PPO | Admitting: Oncology

## 2012-01-13 ENCOUNTER — Encounter: Payer: Self-pay | Admitting: Oncology

## 2012-01-13 ENCOUNTER — Ambulatory Visit (HOSPITAL_BASED_OUTPATIENT_CLINIC_OR_DEPARTMENT_OTHER): Payer: BC Managed Care – PPO | Admitting: Oncology

## 2012-01-13 ENCOUNTER — Telehealth: Payer: Self-pay | Admitting: *Deleted

## 2012-01-13 ENCOUNTER — Ambulatory Visit: Payer: BC Managed Care – PPO | Admitting: Lab

## 2012-01-13 ENCOUNTER — Other Ambulatory Visit: Payer: BC Managed Care – PPO | Admitting: Lab

## 2012-01-13 VITALS — BP 110/73 | HR 84 | Temp 97.5°F | Ht 59.5 in | Wt 174.1 lb

## 2012-01-13 DIAGNOSIS — R5383 Other fatigue: Secondary | ICD-10-CM

## 2012-01-13 DIAGNOSIS — R5381 Other malaise: Secondary | ICD-10-CM

## 2012-01-13 DIAGNOSIS — D649 Anemia, unspecified: Secondary | ICD-10-CM

## 2012-01-13 LAB — CBC WITH DIFFERENTIAL/PLATELET
BASO%: 0.7 % (ref 0.0–2.0)
Basophils Absolute: 0.1 10*3/uL (ref 0.0–0.1)
Eosinophils Absolute: 0.2 10*3/uL (ref 0.0–0.5)
HCT: 39.9 % (ref 34.8–46.6)
HGB: 13.7 g/dL (ref 11.6–15.9)
MCHC: 34.3 g/dL (ref 31.5–36.0)
MONO#: 0.5 10*3/uL (ref 0.1–0.9)
NEUT#: 4.7 10*3/uL (ref 1.5–6.5)
NEUT%: 63.8 % (ref 38.4–76.8)
WBC: 7.3 10*3/uL (ref 3.9–10.3)
lymph#: 1.8 10*3/uL (ref 0.9–3.3)

## 2012-01-13 MED ORDER — VENLAFAXINE HCL ER 37.5 MG PO CP24: 37.5000 mg | ORAL_CAPSULE | Freq: Every day | ORAL | Status: DC

## 2012-01-13 NOTE — Telephone Encounter (Signed)
gave patient appointment for lab and injection appointments printed out calendar and gave to the patient

## 2012-01-13 NOTE — Progress Notes (Signed)
OFFICE PROGRESS NOTE  CC Dr. Adrian Blackwater Dr. Jovita Gamma  DIAGNOSIS: 41 year old female with multifactorial anemia patient has received B12 and iron infusions in the past.  PRIOR THERAPY:  #1 patient has been receiving B12 injections every 3 weeks.  #2 she received IV iron in July 2012.  CURRENT THERAPY:Patient will receive B12 injections every 2 weeks to see if she has improvement in her symptoms.  INTERVAL HISTORY: Jenny Griffin 41 y.o. female returns for Followup visit today. She originally is a patient of one of my partners. He had been treating her for multifactorial anemia she has been receiving B12 injections as well as iron infusions. However in spite of that patient feels that she has not improved her strength. She continues to be very weak tired fatigued. She is unable to do anything at this point. She gets tired out at just walking up and down the stairs. She also has now developed panic attacks which she is not certain as to why this is happening. She has not had any weight loss she has no fevers no chills no night sweats she denies having any chest pains or palpitations no recent hospitalizations. Remainder of the 10 point review of systems is negative.  MEDICAL HISTORY: Past Medical History  Diagnosis Date  . Anemia   . Anxiety   . Panic attacks january 2013    ALLERGIES:   has no known allergies.  MEDICATIONS:  Current Outpatient Prescriptions  Medication Sig Dispense Refill  . venlafaxine (EFFEXOR-XR) 37.5 MG 24 hr capsule Take 1 capsule (37.5 mg total) by mouth daily.  30 capsule  12    SURGICAL HISTORY:  Past Surgical History  Procedure Date  . Cesarean section   . Right oophorectomy     REVIEW OF SYSTEMS:  Constitutional: positive for fatigue and malaise Ears, nose, mouth, throat, and face: negative Respiratory: negative Cardiovascular: negative Gastrointestinal: negative Musculoskeletal:positive for arthralgias, muscle weakness and  myalgias Neurological: positive for headaches, memory problems, paresthesia and weakness Behavioral/Psych: positive for anxiety, fatigue, mood swings and panic attacks   PHYSICAL EXAMINATION: General appearance: alert, cooperative and appears stated age Neck: no adenopathy, no carotid bruit, no JVD, supple, symmetrical, trachea midline and thyroid not enlarged, symmetric, no tenderness/mass/nodules Lymph nodes: Cervical, supraclavicular, and axillary nodes normal. Resp: clear to auscultation bilaterally and normal percussion bilaterally Back: symmetric, no curvature. ROM normal. No CVA tenderness. Cardio: regular rate and rhythm, S1, S2 normal, no murmur, click, rub or gallop GI: soft, non-tender; bowel sounds normal; no masses,  no organomegaly Extremities: extremities normal, atraumatic, no cyanosis or edema Neurologic: Alert and oriented X 3, normal strength and tone. Normal symmetric reflexes. Normal coordination and gait  ECOG PERFORMANCE STATUS: 1 - Symptomatic but completely ambulatory  Blood pressure 110/73, pulse 84, temperature 97.5 F (36.4 C), temperature source Oral, height 4' 11.5" (1.511 m), weight 174 lb 1.6 oz (78.971 kg).  LABORATORY DATA: Lab Results  Component Value Date   WBC 7.3 01/13/2012   HGB 13.7 01/13/2012   HCT 39.9 01/13/2012   MCV 88.6 01/13/2012   PLT 276 01/13/2012      Chemistry      Component Value Date/Time   NA 140 08/24/2011 0908   NA 140 08/24/2011 0908   K 3.9 08/24/2011 0908   K 3.9 08/24/2011 0908   CL 108 08/24/2011 0908   CL 108 08/24/2011 0908   CO2 21 08/24/2011 0908   CO2 21 08/24/2011 0908   BUN 9 08/24/2011 0908  BUN 9 08/24/2011 0908   CREATININE 0.60 08/24/2011 0908   CREATININE 0.60 08/24/2011 0908      Component Value Date/Time   CALCIUM 8.9 08/24/2011 0908   CALCIUM 8.9 08/24/2011 0908   ALKPHOS 70 08/24/2011 0908   ALKPHOS 70 08/24/2011 0908   AST 10 08/24/2011 0908   AST 10 08/24/2011 0908   ALT 11 08/24/2011 0908   ALT 11 08/24/2011  0908   BILITOT 0.3 08/24/2011 0908   BILITOT 0.3 08/24/2011 0908       RADIOGRAPHIC STUDIES:  No results found.  ASSESSMENT: 41 year old female with multifactorial anemia including B12 and iron deficiency. She has been getting replacements for both of them. She is getting B12  Injections every 3 weeks. But she does tell me that when 2 week #2 a right cheek starts feeling very tired again. She has also received iron infusions and initially she did feel better but since then she has not. Today we did check her iron studies to see whether her ferritin is. She also has developed new onset panic attacks of unclear etiology. She does tell me after significant amount of talking but she does have some anxiety and possibly been depression. She apparently has been seeing a therapist in the past. But she has never been on any kind of medications. She continues to complain of having muscle weakness and fatigue. Question really arises whether she could possibly have chronic fatigue syndrome. Or even a neurological issue going on.   PLAN:   #1 we will check iron studies and B12 studies as well and I will also check SPEP as well as quantitative immunoglobulins to rule out rule out a plasma cell dyscrasia. Patient's hemoglobin does look terrific.   #2 I will also get her scheduled to be seen in consultation by a neurologist. I have made the referral.  #3 I do think the patient does have significant anxiety and panic attacks and I have recommended the possibility of using Effexor 37.5 mg daily. A prescription was sent to her pharmacy. Hopefully all of these will help to try to get her energy level back up.  #4 I have also recommended that if patient's ferritin is less than 100 we go ahead and do an infusion with feraheme Patient understands this and I would proceed with my recommendations    All questions were answered. The patient knows to call the clinic with any problems, questions or concerns. We can  certainly see the patient much sooner if necessary.  I spent 40 minutes counseling the patient face to face. The total time spent in the appointment was 40 minutes.    Drue Second, MD Medical/Oncology [facility] [phone] (beeper) [phone] (Office)  01/13/2012, 6:02 PM

## 2012-01-18 ENCOUNTER — Ambulatory Visit (HOSPITAL_BASED_OUTPATIENT_CLINIC_OR_DEPARTMENT_OTHER): Payer: BC Managed Care – PPO

## 2012-01-18 ENCOUNTER — Telehealth: Payer: Self-pay | Admitting: *Deleted

## 2012-01-18 ENCOUNTER — Other Ambulatory Visit: Payer: BC Managed Care – PPO | Admitting: Lab

## 2012-01-18 VITALS — BP 118/72 | HR 80 | Temp 97.8°F

## 2012-01-18 DIAGNOSIS — D649 Anemia, unspecified: Secondary | ICD-10-CM

## 2012-01-18 MED ORDER — CYANOCOBALAMIN 1000 MCG/ML IJ SOLN
1000.0000 ug | Freq: Once | INTRAMUSCULAR | Status: AC
  Administered 2012-01-18: 1000 ug via INTRAMUSCULAR

## 2012-01-18 NOTE — Telephone Encounter (Signed)
dr.dohmeier office called back request to send them the patient demographics fax it to them at [phone]  they will contact the patient with the new date and time and fax the information back to me

## 2012-01-18 NOTE — Telephone Encounter (Signed)
called left message at [phone] with Lafonda Mosses to make patient appointment with dr.carmen dohmeir on 01-18-2012

## 2012-01-19 ENCOUNTER — Telehealth: Payer: Self-pay

## 2012-01-19 NOTE — Telephone Encounter (Signed)
Received call transferred from [facility], scheduling, stating that pt was having some symptoms and needs to speak with a nurse.  Pt reports she got B-12 injection yesterday and felt fine until yesterday evening, when she began to develop "pressure and tightness" in her chest.  Pt states her heart feels like is racing, and it is hard for her to breathe.  Pt states she cannot walk from her office to the restroom without getting out of breath, and after she does do this, she has a severe headache and feels dizzy.  Pt also states she is extremely nauseated, but does not have any pain radiating down her arms.  Informed Dr. Welton Flakes of this, who states for pt to report to ER.  Called pt back [phone], and informed her of this, and she verbalizes understanding.  Pt wants to know are these symptoms from her ferritin being low and then getting a B-12 injection yesterday.  Informed pt these are not typical side effects of B-12 injection, and ferritin does not increase in that short of time span to cause these symptoms.  Pt verbalizes understanding.

## 2012-01-20 ENCOUNTER — Encounter: Payer: Self-pay | Admitting: *Deleted

## 2012-01-20 ENCOUNTER — Telehealth: Payer: Self-pay | Admitting: *Deleted

## 2012-01-20 ENCOUNTER — Other Ambulatory Visit: Payer: BC Managed Care – PPO | Admitting: Lab

## 2012-01-20 ENCOUNTER — Other Ambulatory Visit: Payer: Self-pay | Admitting: *Deleted

## 2012-01-20 ENCOUNTER — Ambulatory Visit (HOSPITAL_BASED_OUTPATIENT_CLINIC_OR_DEPARTMENT_OTHER): Payer: BC Managed Care – PPO

## 2012-01-20 ENCOUNTER — Other Ambulatory Visit: Payer: Self-pay | Admitting: Oncology

## 2012-01-20 DIAGNOSIS — D649 Anemia, unspecified: Secondary | ICD-10-CM

## 2012-01-20 LAB — CBC WITH DIFFERENTIAL/PLATELET
BASO%: 0.5 % (ref 0.0–2.0)
EOS%: 2.7 % (ref 0.0–7.0)
HCT: 39.5 % (ref 34.8–46.6)
LYMPH%: 19.5 % (ref 14.0–49.7)
MCH: 30.3 pg (ref 25.1–34.0)
MCHC: 35.7 g/dL (ref 31.5–36.0)
MCV: 84.8 fL (ref 79.5–101.0)
NEUT%: 70.2 % (ref 38.4–76.8)
Platelets: 272 10*3/uL (ref 145–400)

## 2012-01-20 LAB — SPEP & IFE WITH QIG
Alpha-1-Globulin: 3.7 % (ref 2.9–4.9)
Alpha-2-Globulin: 8.4 % (ref 7.1–11.8)
Beta 2: 6.1 % (ref 3.2–6.5)
Gamma Globulin: 21.4 % — ABNORMAL HIGH (ref 11.1–18.8)
IgM, Serum: 166 mg/dL (ref 52–322)

## 2012-01-20 MED ORDER — SODIUM CHLORIDE 0.9 % IV SOLN
1020.0000 mg | Freq: Once | INTRAVENOUS | Status: AC
  Administered 2012-01-20: 1020 mg via INTRAVENOUS
  Filled 2012-01-20: qty 34

## 2012-01-20 NOTE — Patient Instructions (Signed)
Pt aware of future appts, ambulatory, no complaints.  SLJ

## 2012-01-20 NOTE — Progress Notes (Signed)
1520--Pt requested to stop infusion stating her chest is tight and her heart is racing.  Stopped infusion, NS running.  Symptoms resolved.  Pt stated she feel that it is anxiety related.  Restarted infusion slowly with NS running concurrently, RN stayed with pt for remainder of infusion.  Dr Welton Flakes make aware of situation.  Pt tolerated remainder of infusion well.    Pt observed for after infusion, no complaints.  SLJ

## 2012-01-20 NOTE — Telephone Encounter (Signed)
Reviewed with MD pt's concerns, recent ED visit. Pt states " they said I was okay in the ED but I'm still having headache, dizziness and I couldn't go to work today because of this. I also just had a B-12 shot and I've been feeling like this since I got it."     Pt received B-12 injection on 02/26.   Per MD's note dated 01/13/12 pt's to receive feraheme infusion if ferritin Is less than 100 - labs reflect ferritin  Level -24 on on 01/13/12  Per MD,pt's Ferritin levels lower than normal, pt to receive fereaheme. Pt is not able to come any time except today or tomorrow as she has already called out of work.   After speaking with [facility] Infusion room scheduler appt for today 01/20/12 at 2:30.    Pt verbalized she is able to come in to be treated today at 2:30pm for Feraheme with a lab prior at 2pm.  Pt has had this treatment in past on 06/07/11 -510mg  feraheme and again on 06/14/11- 510mg  Feraheme with out complications.  Pt requested excuse to be out of work for 01/19/12 and today due to treatment.  Discussed with pt I would review with MD. Informed pt she was seen in ED on 01/19/12 and we would not be able to write letter of excuse for that day as she was not seen in this office.   Informed pt if MD agrees to write out of work excuse for todays treatment visit, letter will be given to pt's infusion nurse to be given to her at time of treatment. Pt verbalized understanding and thanked me for the help with her concerns.  Pt denied needing further assistance at this time.

## 2012-01-20 NOTE — Telephone Encounter (Signed)
per notes from 01-20-2012 cancelled appointment for  01-24-2012

## 2012-01-20 NOTE — Telephone Encounter (Signed)
ON 01/19/12 PT. WENT TO THE EMERGENCY ROOM AS INSTRUCTED. AN EKG AND BLOOD WORK WAS DONE. ALL TESTS WERE NORMAL. SHE IS STILL HAVING PROBLEMS WITH HEADACHES, DIZZINESS, HEART RACING, AND NUMBNESS IN HER HANDS. IS THIS FROM "TOO MUCH VITAMIN B12? WHEN WILL THINGS GET BACK TO NORMAL? IS THERE ANYTHING THAT WILL HELP? THIS NOTE TO DR.KHAN'S NURSE, Garald Braver.

## 2012-01-24 ENCOUNTER — Ambulatory Visit: Payer: BC Managed Care – PPO

## 2012-01-24 ENCOUNTER — Other Ambulatory Visit: Payer: BC Managed Care – PPO | Admitting: Lab

## 2012-01-25 ENCOUNTER — Ambulatory Visit: Payer: BC Managed Care – PPO

## 2012-01-31 ENCOUNTER — Telehealth: Payer: Self-pay | Admitting: *Deleted

## 2012-01-31 ENCOUNTER — Encounter: Payer: Self-pay | Admitting: *Deleted

## 2012-01-31 DIAGNOSIS — D649 Anemia, unspecified: Secondary | ICD-10-CM

## 2012-01-31 NOTE — Telephone Encounter (Signed)
Pt called [facility] stating " I have an appt for a b-12 injection tomorrow 03/12 and wondered if I should go back to getting B-12 shots Q 3wks instead of Q 2 weeks, since I had such a bad reaction?"  Will review with MD Cb# [phone]

## 2012-01-31 NOTE — Telephone Encounter (Signed)
Per MD, notified pt 02/01/12 appt for B-12 injection cancelled.  Pt to be seen by MD on 02/08/12 at 10:30 with labs prior.  MD will determine what to do after she has reviewed all of pt's lab results.  Pt verbalized understanding, confirmed appt date and time.

## 2012-02-01 ENCOUNTER — Other Ambulatory Visit: Payer: BC Managed Care – PPO | Admitting: Lab

## 2012-02-01 ENCOUNTER — Ambulatory Visit: Payer: BC Managed Care – PPO

## 2012-02-08 ENCOUNTER — Ambulatory Visit (HOSPITAL_BASED_OUTPATIENT_CLINIC_OR_DEPARTMENT_OTHER): Payer: BC Managed Care – PPO | Admitting: Oncology

## 2012-02-08 ENCOUNTER — Other Ambulatory Visit (HOSPITAL_BASED_OUTPATIENT_CLINIC_OR_DEPARTMENT_OTHER): Payer: BC Managed Care – PPO | Admitting: Lab

## 2012-02-08 ENCOUNTER — Encounter: Payer: Self-pay | Admitting: Oncology

## 2012-02-08 VITALS — BP 115/77 | HR 96 | Temp 98.6°F | Ht 59.0 in | Wt 174.0 lb

## 2012-02-08 DIAGNOSIS — D509 Iron deficiency anemia, unspecified: Secondary | ICD-10-CM

## 2012-02-08 DIAGNOSIS — D649 Anemia, unspecified: Secondary | ICD-10-CM

## 2012-02-08 DIAGNOSIS — R5383 Other fatigue: Secondary | ICD-10-CM

## 2012-02-08 DIAGNOSIS — E538 Deficiency of other specified B group vitamins: Secondary | ICD-10-CM

## 2012-02-08 DIAGNOSIS — F410 Panic disorder [episodic paroxysmal anxiety] without agoraphobia: Secondary | ICD-10-CM

## 2012-02-08 LAB — CBC WITH DIFFERENTIAL/PLATELET
EOS%: 3.6 % (ref 0.0–7.0)
Eosinophils Absolute: 0.2 10*3/uL (ref 0.0–0.5)
MCH: 30.1 pg (ref 25.1–34.0)
MCV: 86.3 fL (ref 79.5–101.0)
MONO%: 5.8 % (ref 0.0–14.0)
NEUT#: 4.5 10*3/uL (ref 1.5–6.5)
RBC: 4.68 10*6/uL (ref 3.70–5.45)
RDW: 12.5 % (ref 11.2–14.5)
lymph#: 1.5 10*3/uL (ref 0.9–3.3)

## 2012-02-08 LAB — IRON AND TIBC
Iron: 164 ug/dL — ABNORMAL HIGH (ref 42–145)
TIBC: 226 ug/dL — ABNORMAL LOW (ref 250–470)
UIBC: 62 ug/dL — ABNORMAL LOW (ref 125–400)

## 2012-02-08 MED ORDER — CYANOCOBALAMIN 1000 MCG/ML IJ SOLN
1000.0000 ug | Freq: Once | INTRAMUSCULAR | Status: AC
  Administered 2012-02-08: 1000 ug via INTRAMUSCULAR

## 2012-02-08 NOTE — Patient Instructions (Signed)
1. We will change your B12 injections to every 3 weeks.  2. I have referred you to Rheumatology for evaluation of aches and pain and excessive fatigue  3. Return to clinic in 3 months for follow up  4. We will call you with results of your labs from today.

## 2012-02-08 NOTE — Progress Notes (Signed)
OFFICE PROGRESS NOTE  CC Dr. Adrian Blackwater Dr. Jovita Gamma  DIAGNOSIS: 41 year old female with multifactorial anemia patient has received B12 and iron infusions in the past.  PRIOR THERAPY:  #1 patient has been receiving B12 injections every 3 weeks.  #2 S/p IV iron in February 2013  CURRENT THERAPY:Patient will receive B12 injections every 2 weeks to see if she has improvement in her symptoms.  INTERVAL HISTORY: Jenny Griffin 41 y.o. female returns for Followup visit today.  She continues to have significant prblems with fatigue. She does ongoing and recurrent panic attacks that are not controlled. She had IV iron and had an episode of an attack in the infuaion and needed quite a bit of assistance by having the nurses "talk" her through it. She does not wish to be on any kind of medications and she is declining a refer to psychotherapist. She has recently quit her job due to stress that she was experiencing. She does state that since having the iron infusion she has a little less aches and pains but she still does not have the stamina.Remainder of the 10 point review of systems is negative.  MEDICAL HISTORY: Past Medical History  Diagnosis Date  . Anemia   . Anxiety   . Panic attacks january 2013    ALLERGIES:   has no known allergies.  MEDICATIONS:  Current Outpatient Prescriptions  Medication Sig Dispense Refill  . venlafaxine (EFFEXOR-XR) 37.5 MG 24 hr capsule Take 1 capsule (37.5 mg total) by mouth daily.  30 capsule  12    SURGICAL HISTORY:  Past Surgical History  Procedure Date  . Cesarean section   . Right oophorectomy     REVIEW OF SYSTEMS:  Constitutional: positive for fatigue and malaise Ears, nose, mouth, throat, and face: negative Respiratory: negative Cardiovascular: negative Gastrointestinal: negative Musculoskeletal:positive for arthralgias, muscle weakness and myalgias Neurological: positive for headaches, memory problems, paresthesia and  weakness Behavioral/Psych: positive for anxiety, fatigue, mood swings and panic attacks   PHYSI ECOG PERFORMANCE STATUS: 1 - Symptomatic but completely ambulatory  Blood pressure 115/77, pulse 96, temperature 98.6 F (37 C), temperature source Oral, height 4\' 11"  (1.499 m), weight 174 lb (78.926 kg).  LABORATORY DATA: Lab Results  Component Value Date   WBC 6.7 02/08/2012   HGB 14.1 02/08/2012   HCT 40.4 02/08/2012   MCV 86.3 02/08/2012   PLT 267 02/08/2012      Chemistry      Component Value Date/Time   NA 140 08/24/2011 0908   NA 140 08/24/2011 0908   K 3.9 08/24/2011 0908   K 3.9 08/24/2011 0908   CL 108 08/24/2011 0908   CL 108 08/24/2011 0908   CO2 21 08/24/2011 0908   CO2 21 08/24/2011 0908   BUN 9 08/24/2011 0908   BUN 9 08/24/2011 0908   CREATININE 0.60 08/24/2011 0908   CREATININE 0.60 08/24/2011 0908      Component Value Date/Time   CALCIUM 8.9 08/24/2011 0908   CALCIUM 8.9 08/24/2011 0908   ALKPHOS 70 08/24/2011 0908   ALKPHOS 70 08/24/2011 0908   AST 10 08/24/2011 0908   AST 10 08/24/2011 0908   ALT 11 08/24/2011 0908   ALT 11 08/24/2011 0908   BILITOT 0.3 08/24/2011 0908   BILITOT 0.3 08/24/2011 0908       RADIOGRAPHIC STUDIES:  No results found.  ASSESSMENT: 41 year old female with:  1.  multifactorial anemia including B12 and iron deficiency. She has been getting replacements for both of them.  She is getting B12  Injections every 3 weeks.  2. Stus post IV iron  3. Panic attacks  4. Fatigue/chronic fatigue  PLAN:  1. Check iron levels  2. Continue B12 injections q 3 weeks  3. Referral to rheumatology  4. Advised getting help for her panic attacks  5. Return to clinic in 3 moths time for follow up All questions were answered. The patient knows to call the clinic with any problems, questions or concerns. We can certainly see the patient much sooner if necessary.  I spent 20 minutes counseling the patient face to face. The total time spent in the  appointment was 30 minutes.    Drue Second, MD Medical/Oncology [facility] [phone] (beeper) [phone] (Office)  02/08/2012, 11:13 AM

## 2012-02-10 ENCOUNTER — Telehealth: Payer: Self-pay | Admitting: Oncology

## 2012-02-10 NOTE — Telephone Encounter (Signed)
Pt is aware to stop by the scheduling desk to pick up the rest of her inj appts and the calendars

## 2012-02-10 NOTE — Telephone Encounter (Signed)
lmonvm of dora the rheumatology secretary regarding an appt that i needed to get set up for a pt. lmwith my name and phone number for a return call

## 2012-02-11 ENCOUNTER — Telehealth: Payer: Self-pay | Admitting: Oncology

## 2012-02-11 NOTE — Telephone Encounter (Signed)
S/w the rheumatologist office regarding an appt with dr levitin. Per their office dr levitin has tretired and dr Zenovia Jordan is seeing all of his patients. gve a copy of the fax document sheet to medical records for them to send over the requested documents to dr Nickola Major office.per andrea they will contact the pt directly with the appt

## 2012-02-15 ENCOUNTER — Other Ambulatory Visit: Payer: BC Managed Care – PPO | Admitting: Lab

## 2012-02-15 ENCOUNTER — Ambulatory Visit: Payer: BC Managed Care – PPO

## 2012-02-17 ENCOUNTER — Telehealth: Payer: Self-pay | Admitting: Oncology

## 2012-02-17 NOTE — Telephone Encounter (Signed)
S/w dora from dr Bernette Redbird office the rheumatologist regarding her appt. Per dora the pt will need to contact the billing office to straighten out her acct that was in another name. Tried to contact the pt with the phone number to the billing office to call. Pt's phone number is disconnected. Was unable to get an appt for the pt until the previous situation is cleared up. Will try the pt again

## 2012-02-29 ENCOUNTER — Ambulatory Visit: Payer: BC Managed Care – PPO

## 2012-02-29 ENCOUNTER — Ambulatory Visit: Payer: BC Managed Care – PPO | Admitting: Oncology

## 2012-02-29 ENCOUNTER — Ambulatory Visit (HOSPITAL_BASED_OUTPATIENT_CLINIC_OR_DEPARTMENT_OTHER): Payer: BC Managed Care – PPO

## 2012-02-29 ENCOUNTER — Other Ambulatory Visit: Payer: BC Managed Care – PPO | Admitting: Lab

## 2012-02-29 ENCOUNTER — Other Ambulatory Visit (HOSPITAL_BASED_OUTPATIENT_CLINIC_OR_DEPARTMENT_OTHER): Payer: BC Managed Care – PPO | Admitting: Lab

## 2012-02-29 VITALS — BP 117/74 | HR 71 | Temp 97.2°F

## 2012-02-29 DIAGNOSIS — D509 Iron deficiency anemia, unspecified: Secondary | ICD-10-CM

## 2012-02-29 DIAGNOSIS — D649 Anemia, unspecified: Secondary | ICD-10-CM

## 2012-02-29 DIAGNOSIS — R5383 Other fatigue: Secondary | ICD-10-CM

## 2012-02-29 LAB — CBC WITH DIFFERENTIAL/PLATELET
EOS%: 3.9 % (ref 0.0–7.0)
Eosinophils Absolute: 0.3 10*3/uL (ref 0.0–0.5)
LYMPH%: 21.4 % (ref 14.0–49.7)
MCH: 30.6 pg (ref 25.1–34.0)
MCV: 88.9 fL (ref 79.5–101.0)
MONO%: 6.1 % (ref 0.0–14.0)
NEUT#: 4.7 10*3/uL (ref 1.5–6.5)
Platelets: 272 10*3/uL (ref 145–400)
RBC: 4.37 10*6/uL (ref 3.70–5.45)
nRBC: 0 % (ref 0–0)

## 2012-02-29 MED ORDER — CYANOCOBALAMIN 1000 MCG/ML IJ SOLN
1000.0000 ug | Freq: Once | INTRAMUSCULAR | Status: AC
  Administered 2012-02-29: 1000 ug via INTRAMUSCULAR

## 2012-03-01 ENCOUNTER — Telehealth: Payer: Self-pay | Admitting: *Deleted

## 2012-03-01 NOTE — Telephone Encounter (Signed)
Message copied by Jaylanie Boschee, Gerald Leitz on Wed Mar 01, 2012  9:09 AM ------      Message from: Victorino December      Created: Tue Feb 29, 2012  6:34 PM       Call patient: B12 level 423

## 2012-03-01 NOTE — Telephone Encounter (Signed)
Per MD notified pt Vitamin B12 level 423 up from 360 3 weeks ago.

## 2012-03-14 ENCOUNTER — Other Ambulatory Visit: Payer: BC Managed Care – PPO | Admitting: Lab

## 2012-03-14 ENCOUNTER — Ambulatory Visit: Payer: BC Managed Care – PPO

## 2012-03-21 ENCOUNTER — Other Ambulatory Visit (HOSPITAL_BASED_OUTPATIENT_CLINIC_OR_DEPARTMENT_OTHER): Payer: BC Managed Care – PPO | Admitting: Lab

## 2012-03-21 ENCOUNTER — Ambulatory Visit (HOSPITAL_BASED_OUTPATIENT_CLINIC_OR_DEPARTMENT_OTHER): Payer: BC Managed Care – PPO

## 2012-03-21 VITALS — BP 118/77 | HR 80

## 2012-03-21 DIAGNOSIS — D649 Anemia, unspecified: Secondary | ICD-10-CM

## 2012-03-21 DIAGNOSIS — R5383 Other fatigue: Secondary | ICD-10-CM

## 2012-03-21 DIAGNOSIS — E538 Deficiency of other specified B group vitamins: Secondary | ICD-10-CM

## 2012-03-21 LAB — CBC WITH DIFFERENTIAL/PLATELET
Basophils Absolute: 0.1 10*3/uL (ref 0.0–0.1)
Eosinophils Absolute: 0.3 10*3/uL (ref 0.0–0.5)
HGB: 13.9 g/dL (ref 11.6–15.9)
LYMPH%: 21.9 % (ref 14.0–49.7)
MCV: 86.2 fL (ref 79.5–101.0)
MONO%: 7.1 % (ref 0.0–14.0)
NEUT#: 4.7 10*3/uL (ref 1.5–6.5)
Platelets: 224 10*3/uL (ref 145–400)
RBC: 4.5 10*6/uL (ref 3.70–5.45)

## 2012-03-21 MED ORDER — CYANOCOBALAMIN 1000 MCG/ML IJ SOLN
1000.0000 ug | Freq: Once | INTRAMUSCULAR | Status: AC
  Administered 2012-03-21: 1000 ug via INTRAMUSCULAR

## 2012-03-28 ENCOUNTER — Ambulatory Visit: Payer: BC Managed Care – PPO

## 2012-03-28 ENCOUNTER — Other Ambulatory Visit: Payer: BC Managed Care – PPO | Admitting: Lab

## 2012-04-05 ENCOUNTER — Telehealth: Payer: Self-pay | Admitting: Medical Oncology

## 2012-04-05 NOTE — Telephone Encounter (Signed)
LMOVM per MD, B12 and CBC were normal.  Patient to call with any questions.  Home number no longer right number for patient.

## 2012-04-05 NOTE — Telephone Encounter (Signed)
Message copied by Tylene Fantasia on Wed Apr 05, 2012  3:51 PM ------      Message from: Faith Rogue F      Created: Wed Apr 05, 2012  3:21 PM      Regarding: pt. call                   ----- Message -----         From: Victorino December, MD         Sent: 03/22/2012  10:14 AM           To: Ricky Stabs, RN            Please call patient: B12 and CBC normal

## 2012-04-06 ENCOUNTER — Telehealth: Payer: Self-pay | Admitting: Medical Oncology

## 2012-04-06 NOTE — Telephone Encounter (Signed)
Attempted to contact patient per MD request.  Home number no longer patient's number.  No answer on mobile number.  Will attempt to contact patient again

## 2012-04-06 NOTE — Telephone Encounter (Signed)
Message copied by Tylene Fantasia on Thu Apr 06, 2012  3:19 PM ------      Message from: Faith Rogue F      Created: Wed Apr 05, 2012  3:21 PM      Regarding: pt. call                   ----- Message -----         From: Victorino December, MD         Sent: 03/22/2012  10:14 AM           To: Ricky Stabs, RN            Please call patient: B12 and CBC normal

## 2012-04-11 ENCOUNTER — Ambulatory Visit (HOSPITAL_BASED_OUTPATIENT_CLINIC_OR_DEPARTMENT_OTHER): Payer: BC Managed Care – PPO

## 2012-04-11 ENCOUNTER — Other Ambulatory Visit (HOSPITAL_BASED_OUTPATIENT_CLINIC_OR_DEPARTMENT_OTHER): Payer: BC Managed Care – PPO | Admitting: Lab

## 2012-04-11 VITALS — BP 113/75 | HR 84 | Temp 97.7°F

## 2012-04-11 DIAGNOSIS — R5383 Other fatigue: Secondary | ICD-10-CM

## 2012-04-11 DIAGNOSIS — D649 Anemia, unspecified: Secondary | ICD-10-CM

## 2012-04-11 LAB — CBC WITH DIFFERENTIAL/PLATELET
BASO%: 1.4 % (ref 0.0–2.0)
EOS%: 4.1 % (ref 0.0–7.0)
HCT: 39.9 % (ref 34.8–46.6)
LYMPH%: 21.5 % (ref 14.0–49.7)
MCH: 30.7 pg (ref 25.1–34.0)
MCHC: 34.9 g/dL (ref 31.5–36.0)
NEUT%: 65.7 % (ref 38.4–76.8)
Platelets: 261 10*3/uL (ref 145–400)

## 2012-04-11 MED ORDER — CYANOCOBALAMIN 1000 MCG/ML IJ SOLN
1000.0000 ug | Freq: Once | INTRAMUSCULAR | Status: AC
  Administered 2012-04-11: 1000 ug via INTRAMUSCULAR

## 2012-04-25 ENCOUNTER — Other Ambulatory Visit: Payer: BC Managed Care – PPO | Admitting: Lab

## 2012-04-25 ENCOUNTER — Ambulatory Visit: Payer: BC Managed Care – PPO

## 2012-05-02 ENCOUNTER — Ambulatory Visit: Payer: BC Managed Care – PPO

## 2012-05-02 ENCOUNTER — Other Ambulatory Visit: Payer: BC Managed Care – PPO | Admitting: Lab

## 2012-05-02 ENCOUNTER — Other Ambulatory Visit (HOSPITAL_BASED_OUTPATIENT_CLINIC_OR_DEPARTMENT_OTHER): Payer: Self-pay | Admitting: Lab

## 2012-05-02 ENCOUNTER — Encounter: Payer: Self-pay | Admitting: Family

## 2012-05-02 ENCOUNTER — Other Ambulatory Visit: Payer: Self-pay | Admitting: Medical Oncology

## 2012-05-02 ENCOUNTER — Telehealth: Payer: Self-pay | Admitting: Oncology

## 2012-05-02 ENCOUNTER — Ambulatory Visit (HOSPITAL_BASED_OUTPATIENT_CLINIC_OR_DEPARTMENT_OTHER): Payer: Self-pay | Admitting: Family

## 2012-05-02 VITALS — BP 112/80 | HR 86 | Temp 98.6°F | Ht 59.0 in | Wt 172.4 lb

## 2012-05-02 DIAGNOSIS — D649 Anemia, unspecified: Secondary | ICD-10-CM

## 2012-05-02 DIAGNOSIS — R5381 Other malaise: Secondary | ICD-10-CM

## 2012-05-02 DIAGNOSIS — E538 Deficiency of other specified B group vitamins: Secondary | ICD-10-CM

## 2012-05-02 DIAGNOSIS — R110 Nausea: Secondary | ICD-10-CM

## 2012-05-02 LAB — CBC WITH DIFFERENTIAL/PLATELET
BASO%: 1.3 % (ref 0.0–2.0)
Basophils Absolute: 0.1 10*3/uL (ref 0.0–0.1)
EOS%: 5 % (ref 0.0–7.0)
HGB: 13.5 g/dL (ref 11.6–15.9)
MCH: 31.3 pg (ref 25.1–34.0)
MCHC: 35 g/dL (ref 31.5–36.0)
MONO#: 0.4 10*3/uL (ref 0.1–0.9)
RDW: 12.2 % (ref 11.2–14.5)
WBC: 6.1 10*3/uL (ref 3.9–10.3)
lymph#: 1.8 10*3/uL (ref 0.9–3.3)

## 2012-05-02 MED ORDER — CYANOCOBALAMIN 1000 MCG/ML IJ SOLN
1000.0000 ug | Freq: Once | INTRAMUSCULAR | Status: AC
  Administered 2012-05-02: 1000 ug via INTRAMUSCULAR

## 2012-05-02 MED ORDER — ONDANSETRON 4 MG PO TBDP: 4.0000 mg | ORAL_TABLET | Freq: Three times a day (TID) | ORAL | Status: AC | PRN

## 2012-05-02 NOTE — Progress Notes (Signed)
  OFFICE PROGRESS NOTE  CC Dr. Adrian Blackwater Dr. Jovita Gamma  DIAGNOSIS:  Multifactorial anemia  PRIOR THERAPY: 1. B12 injections every 3 weeks. 2. IV iron February 2013  CURRENT THERAPY:Patient will receive B12 injections every 2 weeks to see if she has improvement in her symptoms.  INTERVAL HISTORY:  Reports chronic fatigue and myalgias. Says she feels worse as the time for a B12 injection gets close. Has been 3 months since Ferritin level was checked and she requests it today, in fact she had the lab draw an extra tube of blood in anticipation.   Panic attacks have improved with self-regulating methods. These are precipitated by "putting any kind of pill in my mouth, I think I am going to die". Crushes pills and is able to take with no problems.   MEDICAL HISTORY: Past Medical History  Diagnosis Date  . Anemia   . Anxiety   . Panic attacks january 2013    ALLERGIES:   has no known allergies.  PHYSICAL EXAM:  General: Well developed, well nourished, in no acute distress. Accompanied by her husband.  EENT: No ocular or oral lesions. No stomatitis.  Respiratory: Lungs are clear to auscultation bilaterally with normal respiratory movement and no accessory muscle use. Cardiac: No murmur, rub or tachycardia. No upper or lower extremity edema.  GI: Abdomen is soft, no palpable hepatosplenomegaly. No fluid wave. No tenderness. Musculoskeletal: No kyphosis, no tenderness over the spine, ribs or hips. Lymph: No cervical, infraclavicular, axillary or inguinal adenopathy. Neuro: No focal neurological deficits. Psych: Alert and oriented X 3, appropriate mood and affect.    ECOG PERFORMANCE STATUS: 1 - Symptomatic but completely ambulatory  Blood pressure 112/80, pulse 86, temperature 98.6 F (37 C), height 4\' 11"  (1.499 m), weight 172 lb 6.4 oz (78.2 kg).  LABORATORY DATA: Lab Results  Component Value Date   WBC 6.1 05/02/2012   HGB 13.5 05/02/2012   HCT 38.6 05/02/2012   MCV  89.4 05/02/2012   PLT 264 05/02/2012    ASSESSMENT: 41 year old female with: 1.  Multifactorial anemia including B12 and iron deficiency. Has been receiving B12  Injections every 3 weeks. 2.  Panic attacks 3.  Fatigue/chronic fatigue 4. Chronic nausea.   PLAN:  1. Check iron levels (pending).  2. B12 injection today, continue B12 injections q 3 weeks 3. Return to clinic in 3 moths for follow up with Dr. Welton Flakes.  4. Refill Zofran ODT per her request.   All questions were answered. The patient knows to call the clinic with any problems, questions or concerns.

## 2012-05-02 NOTE — Telephone Encounter (Signed)
S/w the pt and she is aware of her July-jan 2014 appts

## 2012-05-09 ENCOUNTER — Ambulatory Visit: Payer: BC Managed Care – PPO

## 2012-05-09 ENCOUNTER — Other Ambulatory Visit: Payer: BC Managed Care – PPO | Admitting: Lab

## 2012-05-10 ENCOUNTER — Ambulatory Visit: Payer: BC Managed Care – PPO | Admitting: Oncology

## 2012-05-23 ENCOUNTER — Other Ambulatory Visit (HOSPITAL_BASED_OUTPATIENT_CLINIC_OR_DEPARTMENT_OTHER): Payer: BC Managed Care – PPO

## 2012-05-23 ENCOUNTER — Ambulatory Visit (HOSPITAL_BASED_OUTPATIENT_CLINIC_OR_DEPARTMENT_OTHER): Payer: BC Managed Care – PPO

## 2012-05-23 VITALS — BP 114/74 | HR 71 | Temp 97.8°F

## 2012-05-23 DIAGNOSIS — E538 Deficiency of other specified B group vitamins: Secondary | ICD-10-CM

## 2012-05-23 DIAGNOSIS — D649 Anemia, unspecified: Secondary | ICD-10-CM

## 2012-05-23 LAB — CBC WITH DIFFERENTIAL/PLATELET
Basophils Absolute: 0.1 10*3/uL (ref 0.0–0.1)
Eosinophils Absolute: 0.4 10*3/uL (ref 0.0–0.5)
HCT: 39.6 % (ref 34.8–46.6)
HGB: 13.5 g/dL (ref 11.6–15.9)
NEUT#: 4.3 10*3/uL (ref 1.5–6.5)
RDW: 12.2 % (ref 11.2–14.5)
lymph#: 1.8 10*3/uL (ref 0.9–3.3)

## 2012-05-23 MED ORDER — CYANOCOBALAMIN 1000 MCG/ML IJ SOLN
1000.0000 ug | Freq: Once | INTRAMUSCULAR | Status: AC
  Administered 2012-05-23: 1000 ug via INTRAMUSCULAR

## 2012-06-05 ENCOUNTER — Other Ambulatory Visit: Payer: Self-pay | Admitting: Oncology

## 2012-06-05 DIAGNOSIS — R110 Nausea: Secondary | ICD-10-CM

## 2012-06-05 DIAGNOSIS — D649 Anemia, unspecified: Secondary | ICD-10-CM

## 2012-06-10 ENCOUNTER — Other Ambulatory Visit: Payer: Self-pay | Admitting: Family

## 2012-06-13 ENCOUNTER — Ambulatory Visit (HOSPITAL_BASED_OUTPATIENT_CLINIC_OR_DEPARTMENT_OTHER): Payer: BC Managed Care – PPO

## 2012-06-13 ENCOUNTER — Other Ambulatory Visit (HOSPITAL_BASED_OUTPATIENT_CLINIC_OR_DEPARTMENT_OTHER): Payer: BC Managed Care – PPO | Admitting: Lab

## 2012-06-13 ENCOUNTER — Other Ambulatory Visit: Payer: BC Managed Care – PPO | Admitting: Lab

## 2012-06-13 VITALS — BP 112/74 | HR 81 | Temp 97.9°F

## 2012-06-13 DIAGNOSIS — D649 Anemia, unspecified: Secondary | ICD-10-CM

## 2012-06-13 DIAGNOSIS — E538 Deficiency of other specified B group vitamins: Secondary | ICD-10-CM

## 2012-06-13 LAB — CBC WITH DIFFERENTIAL/PLATELET
Eosinophils Absolute: 0.3 10*3/uL (ref 0.0–0.5)
MCV: 89.8 fL (ref 79.5–101.0)
MONO#: 0.4 10*3/uL (ref 0.1–0.9)
MONO%: 5.8 % (ref 0.0–14.0)
NEUT#: 5.2 10*3/uL (ref 1.5–6.5)
RBC: 4.55 10*6/uL (ref 3.70–5.45)
RDW: 12.2 % (ref 11.2–14.5)
WBC: 7.6 10*3/uL (ref 3.9–10.3)

## 2012-06-13 MED ORDER — CYANOCOBALAMIN 1000 MCG/ML IJ SOLN
1000.0000 ug | Freq: Once | INTRAMUSCULAR | Status: AC
  Administered 2012-06-13: 1000 ug via INTRAMUSCULAR

## 2012-06-13 NOTE — Patient Instructions (Signed)
Call MD for problems 

## 2012-06-26 ENCOUNTER — Telehealth: Payer: Self-pay | Admitting: Oncology

## 2012-06-26 NOTE — Telephone Encounter (Signed)
Pt lmonvm asking that I call her back re an appt she supposedly has in our office tomorrow. Pt has no appt in our office tomorrow. Returned call and lmonvm for pt re above and confirming d/t for next appt 8/13.

## 2012-06-30 ENCOUNTER — Telehealth: Payer: Self-pay | Admitting: *Deleted

## 2012-06-30 NOTE — Telephone Encounter (Signed)
Pt called lmovm. " I need you to call me" Returned pt's call. Unable to reach pt. LMOVM for pt to call back [phone] and we will be happy to address her concerns

## 2012-07-03 ENCOUNTER — Other Ambulatory Visit: Payer: Self-pay | Admitting: Medical Oncology

## 2012-07-04 ENCOUNTER — Ambulatory Visit (HOSPITAL_BASED_OUTPATIENT_CLINIC_OR_DEPARTMENT_OTHER): Payer: BC Managed Care – PPO

## 2012-07-04 ENCOUNTER — Other Ambulatory Visit: Payer: BC Managed Care – PPO | Admitting: Lab

## 2012-07-04 ENCOUNTER — Other Ambulatory Visit (HOSPITAL_BASED_OUTPATIENT_CLINIC_OR_DEPARTMENT_OTHER): Payer: BC Managed Care – PPO | Admitting: Lab

## 2012-07-04 ENCOUNTER — Other Ambulatory Visit: Payer: Self-pay | Admitting: Medical Oncology

## 2012-07-04 VITALS — BP 102/84 | HR 84 | Temp 97.6°F

## 2012-07-04 DIAGNOSIS — D649 Anemia, unspecified: Secondary | ICD-10-CM

## 2012-07-04 DIAGNOSIS — E538 Deficiency of other specified B group vitamins: Secondary | ICD-10-CM

## 2012-07-04 LAB — CBC WITH DIFFERENTIAL/PLATELET
Eosinophils Absolute: 0.2 10*3/uL (ref 0.0–0.5)
LYMPH%: 20.4 % (ref 14.0–49.7)
MONO#: 0.3 10*3/uL (ref 0.1–0.9)
NEUT#: 4.9 10*3/uL (ref 1.5–6.5)
Platelets: 248 10*3/uL (ref 145–400)
RBC: 4.46 10*6/uL (ref 3.70–5.45)
WBC: 6.9 10*3/uL (ref 3.9–10.3)

## 2012-07-04 LAB — FERRITIN: Ferritin: 196 ng/mL (ref 10–291)

## 2012-07-04 LAB — HOLD TUBE, BLOOD BANK

## 2012-07-04 MED ORDER — CYANOCOBALAMIN 1000 MCG/ML IJ SOLN
1000.0000 ug | Freq: Once | INTRAMUSCULAR | Status: AC
  Administered 2012-07-04: 1000 ug via INTRAMUSCULAR

## 2012-07-25 ENCOUNTER — Other Ambulatory Visit: Payer: Self-pay | Admitting: *Deleted

## 2012-07-25 ENCOUNTER — Telehealth: Payer: Self-pay | Admitting: *Deleted

## 2012-07-25 ENCOUNTER — Ambulatory Visit (HOSPITAL_BASED_OUTPATIENT_CLINIC_OR_DEPARTMENT_OTHER): Payer: BC Managed Care – PPO

## 2012-07-25 ENCOUNTER — Other Ambulatory Visit (HOSPITAL_BASED_OUTPATIENT_CLINIC_OR_DEPARTMENT_OTHER): Payer: BC Managed Care – PPO | Admitting: Lab

## 2012-07-25 ENCOUNTER — Other Ambulatory Visit: Payer: BC Managed Care – PPO | Admitting: Lab

## 2012-07-25 VITALS — BP 109/76 | HR 93 | Temp 98.6°F

## 2012-07-25 DIAGNOSIS — D649 Anemia, unspecified: Secondary | ICD-10-CM

## 2012-07-25 DIAGNOSIS — E538 Deficiency of other specified B group vitamins: Secondary | ICD-10-CM

## 2012-07-25 LAB — CBC WITH DIFFERENTIAL/PLATELET
BASO%: 1.1 % (ref 0.0–2.0)
Basophils Absolute: 0.1 10*3/uL (ref 0.0–0.1)
EOS%: 3.8 % (ref 0.0–7.0)
HCT: 42.8 % (ref 34.8–46.6)
HGB: 14.8 g/dL (ref 11.6–15.9)
MCH: 30.8 pg (ref 25.1–34.0)
MCHC: 34.6 g/dL (ref 31.5–36.0)
MONO#: 0.4 10*3/uL (ref 0.1–0.9)
NEUT%: 70.6 % (ref 38.4–76.8)
RDW: 12.2 % (ref 11.2–14.5)
WBC: 7.7 10*3/uL (ref 3.9–10.3)
lymph#: 1.5 10*3/uL (ref 0.9–3.3)

## 2012-07-25 LAB — COMPREHENSIVE METABOLIC PANEL (CC13)
ALT: 14 U/L (ref 0–55)
AST: 8 U/L (ref 5–34)
Albumin: 3.7 g/dL (ref 3.5–5.0)
CO2: 21 mEq/L — ABNORMAL LOW (ref 22–29)
Calcium: 9.1 mg/dL (ref 8.4–10.4)
Chloride: 109 mEq/L — ABNORMAL HIGH (ref 98–107)
Potassium: 3.7 mEq/L (ref 3.5–5.1)

## 2012-07-25 MED ORDER — CYANOCOBALAMIN 1000 MCG/ML IJ SOLN
1000.0000 ug | Freq: Once | INTRAMUSCULAR | Status: AC
  Administered 2012-07-25: 1000 ug via INTRAMUSCULAR

## 2012-07-25 NOTE — Telephone Encounter (Signed)
Per MD, notified pt cbc normal.

## 2012-07-25 NOTE — Telephone Encounter (Signed)
Message copied by Cooper Render on Tue Jul 25, 2012  3:30 PM ------      Message from: Victorino December      Created: Tue Jul 25, 2012  2:41 PM       Call patient: cbc normal

## 2012-08-09 ENCOUNTER — Telehealth: Payer: Self-pay | Admitting: *Deleted

## 2012-08-09 NOTE — Telephone Encounter (Signed)
Pt called requesting call back with results of iron/ferritin level. No results showing in system at this time. Pt gave new phone # to call her back. [phone]. # given updated in system.  Message forward to MD.

## 2012-08-09 NOTE — Telephone Encounter (Signed)
When results are available let patient know her results

## 2012-08-10 ENCOUNTER — Telehealth: Payer: Self-pay | Admitting: *Deleted

## 2012-08-10 NOTE — Telephone Encounter (Signed)
Orders for iron levels to be drwn on 9/16. No record of lab appt for 9/16 . Called spoke with pt who advised she did not cancel any appts and was not aware of any on that date. Informed pt her Iron/Ferritin can be drwn on 9/23 when she comes in for Lab/MD visit. Pt verbalized understanding and thanked me for the call.

## 2012-08-14 ENCOUNTER — Other Ambulatory Visit: Payer: Self-pay

## 2012-08-14 ENCOUNTER — Ambulatory Visit: Payer: Self-pay | Admitting: Oncology

## 2012-08-15 ENCOUNTER — Other Ambulatory Visit (HOSPITAL_BASED_OUTPATIENT_CLINIC_OR_DEPARTMENT_OTHER): Payer: Self-pay

## 2012-08-15 ENCOUNTER — Other Ambulatory Visit: Payer: Self-pay | Admitting: Medical Oncology

## 2012-08-15 ENCOUNTER — Telehealth: Payer: Self-pay | Admitting: Medical Oncology

## 2012-08-15 ENCOUNTER — Ambulatory Visit (HOSPITAL_BASED_OUTPATIENT_CLINIC_OR_DEPARTMENT_OTHER): Payer: BC Managed Care – PPO

## 2012-08-15 ENCOUNTER — Other Ambulatory Visit: Payer: BC Managed Care – PPO | Admitting: Lab

## 2012-08-15 ENCOUNTER — Ambulatory Visit: Payer: BC Managed Care – PPO

## 2012-08-15 ENCOUNTER — Other Ambulatory Visit: Payer: Self-pay | Admitting: Lab

## 2012-08-15 VITALS — BP 107/70 | HR 84 | Temp 98.5°F

## 2012-08-15 DIAGNOSIS — D649 Anemia, unspecified: Secondary | ICD-10-CM

## 2012-08-15 DIAGNOSIS — D500 Iron deficiency anemia secondary to blood loss (chronic): Secondary | ICD-10-CM

## 2012-08-15 DIAGNOSIS — E538 Deficiency of other specified B group vitamins: Secondary | ICD-10-CM

## 2012-08-15 LAB — FERRITIN: Ferritin: 128 ng/mL (ref 10–291)

## 2012-08-15 MED ORDER — CYANOCOBALAMIN 1000 MCG/ML IJ SOLN
1000.0000 ug | Freq: Once | INTRAMUSCULAR | Status: AC
  Administered 2012-08-15: 1000 ug via INTRAMUSCULAR

## 2012-08-15 NOTE — Telephone Encounter (Signed)
Per MD, patients iron studies show that ferritin level is normal and that there were no new recommendations.  Patient requested to know what ferritin level was, 128, then stated that she "feels absolutely awful and I think I would feel better if I went ahead and had an iron transfusion.  I felt great when my ferritin level was between 466 and 196.  I had my labs drawn less than a month ago and the ferritin has already dropped to 128.  I would just feel better if we could get that ferritin level back up higher.  I wasn't even able to go to work again today because I feel so bad."  Advised patient that I would inform MD of her request and return a call to her.  Patient expressed understanding, no questions at this time.  Will review with MD

## 2012-08-15 NOTE — Telephone Encounter (Signed)
Message copied by Tylene Fantasia on Tue Aug 15, 2012  4:22 PM ------      Message from: Victorino December      Created: Tue Aug 15, 2012  4:15 PM       Please call patient: Jenny Griffin

## 2012-08-16 ENCOUNTER — Other Ambulatory Visit: Payer: Self-pay | Admitting: Oncology

## 2012-08-16 ENCOUNTER — Telehealth: Payer: Self-pay | Admitting: *Deleted

## 2012-08-16 ENCOUNTER — Other Ambulatory Visit: Payer: Self-pay | Admitting: Medical Oncology

## 2012-08-16 DIAGNOSIS — D509 Iron deficiency anemia, unspecified: Secondary | ICD-10-CM

## 2012-08-16 NOTE — Telephone Encounter (Signed)
please schedule patient for IV feraheme  Sent michelle email to set up patient for iv iron

## 2012-08-16 NOTE — Telephone Encounter (Signed)
Per staff message and POF I have scheduled appt.  JMW  

## 2012-08-16 NOTE — Telephone Encounter (Signed)
Onc TX schedule sent for IV feraheme

## 2012-08-16 NOTE — Telephone Encounter (Signed)
Orders done for Friday of this week

## 2012-08-16 NOTE — Telephone Encounter (Signed)
Sure she can have another infusion of iron

## 2012-08-18 ENCOUNTER — Ambulatory Visit (HOSPITAL_BASED_OUTPATIENT_CLINIC_OR_DEPARTMENT_OTHER): Payer: Self-pay

## 2012-08-18 ENCOUNTER — Other Ambulatory Visit: Payer: Self-pay | Admitting: Oncology

## 2012-08-18 ENCOUNTER — Other Ambulatory Visit: Payer: Self-pay | Admitting: *Deleted

## 2012-08-18 VITALS — BP 113/72 | HR 80 | Temp 98.5°F

## 2012-08-18 DIAGNOSIS — D649 Anemia, unspecified: Secondary | ICD-10-CM

## 2012-08-18 DIAGNOSIS — D509 Iron deficiency anemia, unspecified: Secondary | ICD-10-CM

## 2012-08-18 MED ORDER — SODIUM CHLORIDE 0.9 % IV SOLN
1020.0000 mg | Freq: Once | INTRAVENOUS | Status: AC
  Administered 2012-08-18: 1020 mg via INTRAVENOUS
  Filled 2012-08-18: qty 34

## 2012-08-18 MED ORDER — DIPHENHYDRAMINE HCL 50 MG/ML IJ SOLN
50.0000 mg | Freq: Once | INTRAMUSCULAR | Status: AC
  Administered 2012-08-18: 50 mg via INTRAVENOUS

## 2012-08-18 MED ORDER — ACETAMINOPHEN 325 MG PO TABS
650.0000 mg | ORAL_TABLET | Freq: Once | ORAL | Status: AC
  Administered 2012-08-18: 650 mg via ORAL

## 2012-08-18 MED ORDER — METHYLPREDNISOLONE SODIUM SUCC 40 MG IJ SOLR
40.0000 mg | Freq: Once | INTRAMUSCULAR | Status: AC
Start: 2012-08-18 — End: 2012-08-18
  Administered 2012-08-18: 40 mg via INTRAVENOUS

## 2012-08-18 NOTE — Patient Instructions (Signed)
Ferumoxytol injection What is this medicine? FERUMOXYTOL is an iron complex. Iron is used to make healthy red blood cells, which carry oxygen and nutrients throughout the body. This medicine is used to treat iron deficiency anemia in people with chronic kidney disease. This medicine may be used for other purposes; ask your health care provider or pharmacist if you have questions. What should I tell my health care provider before I take this medicine? They need to know if you have any of these conditions: -anemia not caused by low iron levels -high levels of iron in the blood -magnetic resonance imaging (MRI) test scheduled -an unusual or allergic reaction to iron, other medicines, foods, dyes, or preservatives -pregnant or trying to get pregnant -breast-feeding How should I use this medicine? This medicine is for infusion into a vein. It is given by a health care professional in a hospital or clinic setting. Talk to your pediatrician regarding the use of this medicine in children. Special care may be needed. Overdosage: If you think you've taken too much of this medicine contact a poison control center or emergency room at once. Overdosage: If you think you have taken too much of this medicine contact a poison control center or emergency room at once. NOTE: This medicine is only for you. Do not share this medicine with others. What if I miss a dose? It is important not to miss your dose. Call your doctor or health care professional if you are unable to keep an appointment. What may interact with this medicine? This medicine may interact with the following medications: -other iron products This list may not describe all possible interactions. Give your health care provider a list of all the medicines, herbs, non-prescription drugs, or dietary supplements you use. Also tell them if you smoke, drink alcohol, or use illegal drugs. Some items may interact with your medicine. What should I watch  for while using this medicine? Visit your doctor or healthcare professional regularly. Tell your doctor or healthcare professional if your symptoms do not start to get better or if they get worse. You may need blood work done while you are taking this medicine. You may need to follow a special diet. Talk to your doctor. Foods that contain iron include: whole grains/cereals, dried fruits, beans, or peas, leafy green vegetables, and organ meats (liver, kidney). What side effects may I notice from receiving this medicine? Side effects that you should report to your doctor or health care professional as soon as possible: -allergic reactions like skin rash, itching or hives, swelling of the face, lips, or tongue -breathing problems -changes in blood pressure -feeling faint or lightheaded, falls -fever or chills -flushing, sweating, or hot feelings -swelling of the ankles or feet Side effects that usually do not require medical attention (Report these to your doctor or health care professional if they continue or are bothersome.): -diarrhea -headache -nausea, vomiting -stomach pain This list may not describe all possible side effects. Call your doctor for medical advice about side effects. You may report side effects to FDA at 1-800-FDA-1088. Where should I keep my medicine? This drug is given in a hospital or clinic and will not be stored at home. NOTE: This sheet is a summary. It may not cover all possible information. If you have questions about this medicine, talk to your doctor, pharmacist, or health care provider.  2012, [employer] Standard. (07/31/2008 9:48:25 PM) 

## 2012-08-20 ENCOUNTER — Emergency Department: Payer: Self-pay | Admitting: Emergency Medicine

## 2012-08-20 LAB — CBC
HCT: 37.6 % (ref 35.0–47.0)
MCH: 31.3 pg (ref 26.0–34.0)
MCHC: 35.5 g/dL (ref 32.0–36.0)
MCV: 88 fL (ref 80–100)
Platelet: 185 10*3/uL (ref 150–440)
RDW: 12.4 % (ref 11.5–14.5)

## 2012-08-20 LAB — COMPREHENSIVE METABOLIC PANEL
Anion Gap: 12 (ref 7–16)
BUN: 8 mg/dL (ref 7–18)
Calcium, Total: 8.3 mg/dL — ABNORMAL LOW (ref 8.5–10.1)
Chloride: 109 mmol/L — ABNORMAL HIGH (ref 98–107)
Co2: 24 mmol/L (ref 21–32)
EGFR (African American): >60
EGFR (Non-African Amer.): >60
SGOT(AST): 8 U/L — ABNORMAL LOW (ref 15–37)
SGPT (ALT): 17 U/L (ref 12–78)

## 2012-08-20 LAB — CK TOTAL AND CKMB (NOT AT ARMC): CK, Total: 61 U/L (ref 21–215)

## 2012-08-28 ENCOUNTER — Ambulatory Visit: Payer: Self-pay | Admitting: Oncology

## 2012-09-05 ENCOUNTER — Ambulatory Visit (HOSPITAL_BASED_OUTPATIENT_CLINIC_OR_DEPARTMENT_OTHER): Payer: Self-pay

## 2012-09-05 ENCOUNTER — Other Ambulatory Visit: Payer: Self-pay | Admitting: Emergency Medicine

## 2012-09-05 ENCOUNTER — Other Ambulatory Visit: Payer: BC Managed Care – PPO | Admitting: Lab

## 2012-09-05 ENCOUNTER — Other Ambulatory Visit (HOSPITAL_BASED_OUTPATIENT_CLINIC_OR_DEPARTMENT_OTHER): Payer: Self-pay | Admitting: Lab

## 2012-09-05 VITALS — BP 113/65 | HR 81 | Temp 98.3°F

## 2012-09-05 DIAGNOSIS — D649 Anemia, unspecified: Secondary | ICD-10-CM

## 2012-09-05 DIAGNOSIS — D518 Other vitamin B12 deficiency anemias: Secondary | ICD-10-CM

## 2012-09-05 LAB — CBC WITH DIFFERENTIAL/PLATELET
Basophils Absolute: 0.1 10*3/uL (ref 0.0–0.1)
EOS%: 3.8 % (ref 0.0–7.0)
HCT: 40.2 % (ref 34.8–46.6)
HGB: 14 g/dL (ref 11.6–15.9)
LYMPH%: 23.6 % (ref 14.0–49.7)
MCH: 31.4 pg (ref 25.1–34.0)
MCV: 90 fL (ref 79.5–101.0)
MONO%: 7.5 % (ref 0.0–14.0)
NEUT%: 63.9 % (ref 38.4–76.8)
Platelets: 256 10*3/uL (ref 145–400)
RDW: 12.8 % (ref 11.2–14.5)

## 2012-09-05 MED ORDER — CYANOCOBALAMIN 1000 MCG/ML IJ SOLN
1000.0000 ug | Freq: Once | INTRAMUSCULAR | Status: AC
  Administered 2012-09-05: 1000 ug via INTRAMUSCULAR

## 2012-09-26 ENCOUNTER — Ambulatory Visit (HOSPITAL_BASED_OUTPATIENT_CLINIC_OR_DEPARTMENT_OTHER): Payer: Self-pay

## 2012-09-26 ENCOUNTER — Other Ambulatory Visit: Payer: BC Managed Care – PPO | Admitting: Lab

## 2012-09-26 ENCOUNTER — Other Ambulatory Visit: Payer: Self-pay | Admitting: Emergency Medicine

## 2012-09-26 ENCOUNTER — Other Ambulatory Visit (HOSPITAL_BASED_OUTPATIENT_CLINIC_OR_DEPARTMENT_OTHER): Payer: Self-pay | Admitting: Lab

## 2012-09-26 VITALS — BP 108/74 | HR 84 | Temp 98.6°F

## 2012-09-26 DIAGNOSIS — E538 Deficiency of other specified B group vitamins: Secondary | ICD-10-CM

## 2012-09-26 DIAGNOSIS — D649 Anemia, unspecified: Secondary | ICD-10-CM

## 2012-09-26 LAB — CBC WITH DIFFERENTIAL/PLATELET
Eosinophils Absolute: 0.2 10*3/uL (ref 0.0–0.5)
HCT: 40.5 % (ref 34.8–46.6)
HGB: 14.5 g/dL (ref 11.6–15.9)
LYMPH%: 22.5 % (ref 14.0–49.7)
MONO#: 0.5 10*3/uL (ref 0.1–0.9)
NEUT#: 4.5 10*3/uL (ref 1.5–6.5)
NEUT%: 66.2 % (ref 38.4–76.8)
Platelets: 228 10*3/uL (ref 145–400)
WBC: 6.8 10*3/uL (ref 3.9–10.3)
lymph#: 1.5 10*3/uL (ref 0.9–3.3)

## 2012-09-26 MED ORDER — CYANOCOBALAMIN 1000 MCG/ML IJ SOLN
1000.0000 ug | Freq: Once | INTRAMUSCULAR | Status: AC
  Administered 2012-09-26: 1000 ug via INTRAMUSCULAR

## 2012-10-17 ENCOUNTER — Other Ambulatory Visit: Payer: Self-pay | Admitting: Emergency Medicine

## 2012-10-17 ENCOUNTER — Other Ambulatory Visit: Payer: Self-pay | Admitting: Lab

## 2012-10-17 ENCOUNTER — Other Ambulatory Visit: Payer: BC Managed Care – PPO | Admitting: Lab

## 2012-10-17 ENCOUNTER — Ambulatory Visit: Payer: BC Managed Care – PPO

## 2012-10-17 DIAGNOSIS — D649 Anemia, unspecified: Secondary | ICD-10-CM

## 2012-11-03 ENCOUNTER — Ambulatory Visit (HOSPITAL_BASED_OUTPATIENT_CLINIC_OR_DEPARTMENT_OTHER): Payer: Self-pay

## 2012-11-03 VITALS — BP 112/73 | HR 106 | Temp 99.1°F

## 2012-11-03 DIAGNOSIS — D649 Anemia, unspecified: Secondary | ICD-10-CM

## 2012-11-03 DIAGNOSIS — E538 Deficiency of other specified B group vitamins: Secondary | ICD-10-CM

## 2012-11-03 MED ORDER — CYANOCOBALAMIN 1000 MCG/ML IJ SOLN
1000.0000 ug | Freq: Once | INTRAMUSCULAR | Status: AC
  Administered 2012-11-03: 1000 ug via INTRAMUSCULAR

## 2012-11-07 ENCOUNTER — Other Ambulatory Visit: Payer: BC Managed Care – PPO | Admitting: Lab

## 2012-11-07 ENCOUNTER — Other Ambulatory Visit: Payer: Self-pay | Admitting: Lab

## 2012-11-07 ENCOUNTER — Ambulatory Visit: Payer: BC Managed Care – PPO

## 2012-11-24 ENCOUNTER — Other Ambulatory Visit (HOSPITAL_BASED_OUTPATIENT_CLINIC_OR_DEPARTMENT_OTHER): Payer: Self-pay

## 2012-11-24 ENCOUNTER — Ambulatory Visit (HOSPITAL_BASED_OUTPATIENT_CLINIC_OR_DEPARTMENT_OTHER): Payer: Self-pay

## 2012-11-24 ENCOUNTER — Other Ambulatory Visit: Payer: Self-pay

## 2012-11-24 VITALS — BP 113/67 | HR 78 | Temp 97.4°F | Resp 20

## 2012-11-24 DIAGNOSIS — D649 Anemia, unspecified: Secondary | ICD-10-CM

## 2012-11-24 DIAGNOSIS — E538 Deficiency of other specified B group vitamins: Secondary | ICD-10-CM

## 2012-11-24 LAB — CBC WITH DIFFERENTIAL/PLATELET
Eosinophils Absolute: 0.2 10*3/uL (ref 0.0–0.5)
HCT: 39.4 % (ref 34.8–46.6)
LYMPH%: 26.6 % (ref 14.0–49.7)
MCHC: 34.9 g/dL (ref 31.5–36.0)
MCV: 90.3 fL (ref 79.5–101.0)
MONO#: 0.5 10*3/uL (ref 0.1–0.9)
MONO%: 7.3 % (ref 0.0–14.0)
NEUT#: 4.5 10*3/uL (ref 1.5–6.5)
NEUT%: 62 % (ref 38.4–76.8)
Platelets: 269 10*3/uL (ref 145–400)
WBC: 7.3 10*3/uL (ref 3.9–10.3)

## 2012-11-24 LAB — COMPREHENSIVE METABOLIC PANEL (CC13)
CO2: 24 mEq/L (ref 22–29)
Creatinine: 0.7 mg/dL (ref 0.6–1.1)
Glucose: 97 mg/dl (ref 70–99)
Total Bilirubin: 0.35 mg/dL (ref 0.20–1.20)

## 2012-11-24 MED ORDER — CYANOCOBALAMIN 1000 MCG/ML IJ SOLN
1000.0000 ug | Freq: Once | INTRAMUSCULAR | Status: AC
  Administered 2012-11-24: 1000 ug via INTRAMUSCULAR

## 2012-11-28 ENCOUNTER — Other Ambulatory Visit: Payer: Self-pay | Admitting: Lab

## 2012-11-28 ENCOUNTER — Other Ambulatory Visit: Payer: BC Managed Care – PPO | Admitting: Lab

## 2012-11-28 ENCOUNTER — Ambulatory Visit: Payer: BC Managed Care – PPO

## 2012-12-07 ENCOUNTER — Other Ambulatory Visit: Payer: Self-pay | Admitting: Medical Oncology

## 2012-12-07 DIAGNOSIS — R110 Nausea: Secondary | ICD-10-CM

## 2012-12-07 DIAGNOSIS — D649 Anemia, unspecified: Secondary | ICD-10-CM

## 2012-12-08 ENCOUNTER — Other Ambulatory Visit: Payer: Self-pay | Admitting: Lab

## 2012-12-08 ENCOUNTER — Ambulatory Visit: Payer: Self-pay

## 2012-12-12 ENCOUNTER — Telehealth: Payer: Self-pay | Admitting: *Deleted

## 2012-12-12 NOTE — Telephone Encounter (Signed)
Patient's husband called back and was given new appts for 1/24.  JMW

## 2012-12-12 NOTE — Telephone Encounter (Signed)
I have called and left the patient message to call me back. Need to appts on 1/24 to later due to a meeting.  JMW

## 2012-12-14 ENCOUNTER — Telehealth: Payer: Self-pay | Admitting: Medical Oncology

## 2012-12-14 ENCOUNTER — Other Ambulatory Visit: Payer: Self-pay | Admitting: Medical Oncology

## 2012-12-14 DIAGNOSIS — D649 Anemia, unspecified: Secondary | ICD-10-CM

## 2012-12-14 NOTE — Telephone Encounter (Signed)
Per MD,  Iron studies ordered with tomorrow's scheduled lab appt. Patient informed. No further questions at this time.

## 2012-12-14 NOTE — Telephone Encounter (Signed)
Please add iron studies

## 2012-12-14 NOTE — Telephone Encounter (Signed)
Patient's husband called stating his wife has been feeling tired and asking if her iron level can be checked when she has labs drawn for her scheduled appt 01/24. Patient receives monthly B-12 injections, last inj was 11/24/12. Will review req with MD. Clovis Cao appt with lab/injection 01/24.

## 2012-12-15 ENCOUNTER — Telehealth: Payer: Self-pay | Admitting: Oncology

## 2012-12-15 ENCOUNTER — Other Ambulatory Visit: Payer: Self-pay | Admitting: Medical Oncology

## 2012-12-15 ENCOUNTER — Telehealth: Payer: Self-pay | Admitting: Medical Oncology

## 2012-12-15 ENCOUNTER — Other Ambulatory Visit: Payer: Self-pay | Admitting: *Deleted

## 2012-12-15 ENCOUNTER — Other Ambulatory Visit: Payer: Self-pay | Admitting: Lab

## 2012-12-15 ENCOUNTER — Ambulatory Visit (HOSPITAL_BASED_OUTPATIENT_CLINIC_OR_DEPARTMENT_OTHER): Payer: Self-pay

## 2012-12-15 VITALS — BP 117/82 | HR 84 | Temp 97.9°F

## 2012-12-15 DIAGNOSIS — D509 Iron deficiency anemia, unspecified: Secondary | ICD-10-CM

## 2012-12-15 DIAGNOSIS — M542 Cervicalgia: Secondary | ICD-10-CM

## 2012-12-15 DIAGNOSIS — D649 Anemia, unspecified: Secondary | ICD-10-CM

## 2012-12-15 DIAGNOSIS — R110 Nausea: Secondary | ICD-10-CM

## 2012-12-15 DIAGNOSIS — E538 Deficiency of other specified B group vitamins: Secondary | ICD-10-CM

## 2012-12-15 LAB — COMPREHENSIVE METABOLIC PANEL (CC13)
ALT: 12 U/L (ref 0–55)
Albumin: 3.6 g/dL (ref 3.5–5.0)
BUN: 10.1 mg/dL (ref 7.0–26.0)
CO2: 23 mEq/L (ref 22–29)
Calcium: 9.2 mg/dL (ref 8.4–10.4)
Chloride: 106 mEq/L (ref 98–107)
Creatinine: 0.7 mg/dL (ref 0.6–1.1)
Potassium: 3.6 mEq/L (ref 3.5–5.1)

## 2012-12-15 LAB — IRON AND TIBC
Iron: 101 ug/dL (ref 42–145)
UIBC: 134 ug/dL (ref 125–400)

## 2012-12-15 LAB — CBC WITH DIFFERENTIAL/PLATELET
BASO%: 1 % (ref 0.0–2.0)
Basophils Absolute: 0.1 10*3/uL (ref 0.0–0.1)
HCT: 40.7 % (ref 34.8–46.6)
HGB: 14.4 g/dL (ref 11.6–15.9)
MONO#: 0.5 10*3/uL (ref 0.1–0.9)
NEUT%: 65.1 % (ref 38.4–76.8)
WBC: 7.7 10*3/uL (ref 3.9–10.3)
lymph#: 1.9 10*3/uL (ref 0.9–3.3)

## 2012-12-15 LAB — FERRITIN: Ferritin: 386 ng/mL — ABNORMAL HIGH (ref 10–291)

## 2012-12-15 MED ORDER — CYANOCOBALAMIN 1000 MCG/ML IJ SOLN
1000.0000 ug | Freq: Once | INTRAMUSCULAR | Status: AC
  Administered 2012-12-15: 1000 ug via INTRAMUSCULAR

## 2012-12-15 NOTE — Telephone Encounter (Signed)
Patient in clinic today for labs stopped at scheduling to request future appts to be scheduled for labs/MD/injections. Per patient treatment plan, B-12 injections every 3 weeks. No future sched appts at this time. Will review plan and appt needs with MD/NP.

## 2012-12-15 NOTE — Telephone Encounter (Signed)
Pt stopped today to iron level added to lbs for today. Pt also requested that additional appts be schedule. Message to desk nurse re orders/pof futher appts and f/u.

## 2012-12-15 NOTE — Telephone Encounter (Signed)
Per MD, onc tx sent for patient to be scheduled to see an NP/PA in the next few weeks and for patient to be notified.

## 2012-12-15 NOTE — Telephone Encounter (Signed)
Please have patient see one of the NP/PA for an appointment in the next few weeks

## 2012-12-15 NOTE — Telephone Encounter (Signed)
lmonvm for pt re 2/6 appt w/LA. Schedule mailed.

## 2012-12-28 ENCOUNTER — Ambulatory Visit: Payer: Self-pay | Admitting: Adult Health

## 2013-01-04 ENCOUNTER — Ambulatory Visit (HOSPITAL_BASED_OUTPATIENT_CLINIC_OR_DEPARTMENT_OTHER): Payer: Self-pay

## 2013-01-04 ENCOUNTER — Other Ambulatory Visit: Payer: Self-pay | Admitting: Lab

## 2013-01-04 ENCOUNTER — Telehealth: Payer: Self-pay | Admitting: Oncology

## 2013-01-04 ENCOUNTER — Ambulatory Visit: Payer: Self-pay | Admitting: Adult Health

## 2013-01-04 VITALS — BP 115/75 | HR 76 | Temp 97.8°F

## 2013-01-04 DIAGNOSIS — E538 Deficiency of other specified B group vitamins: Secondary | ICD-10-CM

## 2013-01-04 DIAGNOSIS — D649 Anemia, unspecified: Secondary | ICD-10-CM

## 2013-01-04 MED ORDER — CYANOCOBALAMIN 1000 MCG/ML IJ SOLN
1000.0000 ug | Freq: Once | INTRAMUSCULAR | Status: AC
  Administered 2013-01-04: 1000 ug via INTRAMUSCULAR

## 2013-01-04 NOTE — Telephone Encounter (Signed)
called pt to r/s appt.Marland KitchenMarland KitchenMarland KitchenDone

## 2013-01-10 ENCOUNTER — Telehealth: Payer: Self-pay | Admitting: Oncology

## 2013-01-15 ENCOUNTER — Telehealth: Payer: Self-pay | Admitting: Oncology

## 2013-01-15 NOTE — Telephone Encounter (Signed)
Pt called today to cx 2/25 appt. pt does not want to come in again until 3/10 lb/fu/inj.

## 2013-01-16 ENCOUNTER — Other Ambulatory Visit: Payer: Self-pay | Admitting: Lab

## 2013-01-16 ENCOUNTER — Ambulatory Visit: Payer: BC Managed Care – PPO

## 2013-01-16 ENCOUNTER — Ambulatory Visit: Payer: Self-pay | Admitting: Family

## 2013-01-17 ENCOUNTER — Telehealth: Payer: Self-pay | Admitting: Medical Oncology

## 2013-01-17 NOTE — Telephone Encounter (Signed)
Pt's spouse with question as to when pt with apt and requesting refill of zofran. Informed spouse pt sched for apt 03/10 with Lab/NP/tx and to call pharmacy regarding prescription. Spouse verbalized understanding, no further questions at this time.

## 2013-01-20 ENCOUNTER — Emergency Department: Payer: Self-pay | Admitting: Internal Medicine

## 2013-01-29 ENCOUNTER — Other Ambulatory Visit: Payer: Self-pay | Admitting: Lab

## 2013-01-29 ENCOUNTER — Encounter: Payer: Self-pay | Admitting: Adult Health

## 2013-01-29 ENCOUNTER — Ambulatory Visit (HOSPITAL_BASED_OUTPATIENT_CLINIC_OR_DEPARTMENT_OTHER): Payer: BC Managed Care – PPO

## 2013-01-29 ENCOUNTER — Ambulatory Visit: Payer: Self-pay

## 2013-01-29 ENCOUNTER — Ambulatory Visit (HOSPITAL_BASED_OUTPATIENT_CLINIC_OR_DEPARTMENT_OTHER): Payer: BC Managed Care – PPO | Admitting: Adult Health

## 2013-01-29 ENCOUNTER — Ambulatory Visit: Payer: Self-pay | Admitting: Adult Health

## 2013-01-29 ENCOUNTER — Other Ambulatory Visit (HOSPITAL_BASED_OUTPATIENT_CLINIC_OR_DEPARTMENT_OTHER): Payer: BC Managed Care – PPO

## 2013-01-29 ENCOUNTER — Telehealth: Payer: Self-pay | Admitting: Oncology

## 2013-01-29 VITALS — BP 108/73 | HR 81 | Temp 98.0°F | Resp 20 | Ht 59.0 in | Wt 172.0 lb

## 2013-01-29 DIAGNOSIS — D509 Iron deficiency anemia, unspecified: Secondary | ICD-10-CM

## 2013-01-29 DIAGNOSIS — E538 Deficiency of other specified B group vitamins: Secondary | ICD-10-CM

## 2013-01-29 LAB — CBC WITH DIFFERENTIAL/PLATELET
BASO%: 2 % (ref 0.0–2.0)
EOS%: 7.7 % — ABNORMAL HIGH (ref 0.0–7.0)
LYMPH%: 28.3 % (ref 14.0–49.7)
MCH: 30.9 pg (ref 25.1–34.0)
MCHC: 35.2 g/dL (ref 31.5–36.0)
MCV: 88 fL (ref 79.5–101.0)
MONO%: 7 % (ref 0.0–14.0)
Platelets: 262 10*3/uL (ref 145–400)
RBC: 4.4 10*6/uL (ref 3.70–5.45)
WBC: 7.1 10*3/uL (ref 3.9–10.3)

## 2013-01-29 LAB — IRON AND TIBC
Iron: 118 ug/dL (ref 42–145)
UIBC: 107 ug/dL — ABNORMAL LOW (ref 125–400)

## 2013-01-29 MED ORDER — CYANOCOBALAMIN 1000 MCG/ML IJ SOLN
1000.0000 ug | Freq: Once | INTRAMUSCULAR | Status: AC
  Administered 2013-01-29: 1000 ug via INTRAMUSCULAR

## 2013-01-29 NOTE — Telephone Encounter (Signed)
lmonvm adviisng the pt of her march 28th appts for the lab and inj. Pt will pick up the rest of her schedules at that time.

## 2013-01-29 NOTE — Patient Instructions (Addendum)
Proceed with B12 today.  We will get your iron results this afternoon or tomorrow morning.  Please call us if you have any questions or concerns.

## 2013-01-29 NOTE — Progress Notes (Signed)
OFFICE PROGRESS NOTE  CC Dr. Adrian Blackwater Dr. Jovita Gamma  DIAGNOSIS: 42 year old female with multifactorial anemia patient has received B12 and iron infusions in the past.  PRIOR THERAPY:  #1 patient has been receiving B12 injections every 3 weeks.  #2 S/p IV iron in February 2013  CURRENT THERAPY:Patient will receive B12 injections every 2 weeks to see if she has improvement in her symptoms.  INTERVAL HISTORY: Jenny Griffin 42 y.o. female returns for Followup visit today.  She continues to have fatigue about a week and a half after she receives her B12 injection, until her next injection.  Also, her stomach hurts, and this usually happens when her iron is low.  She is very fatigued and just wants to feel better.  Otherwise, a 10 point ROS is neg.   MEDICAL HISTORY: Past Medical History  Diagnosis Date  . Anemia   . Anxiety   . Panic attacks january 2013    ALLERGIES:  has No Known Allergies.  MEDICATIONS:  Current Outpatient Prescriptions  Medication Sig Dispense Refill  . ondansetron (ZOFRAN) 4 MG tablet TAKE 1 TABLET BY MOUTH EVERY 4 HOURS AS NEEDED FOR NAUSEA AND VOMITING  30 tablet  3  . ondansetron (ZOFRAN-ODT) 4 MG disintegrating tablet        No current facility-administered medications for this visit.    SURGICAL HISTORY:  Past Surgical History  Procedure Laterality Date  . Cesarean section    . Right oophorectomy      REVIEW OF SYSTEMS:   General: fatigue (+), night sweats (-), fever (-), pain (-) Lymph: palpable nodes (-) HEENT: vision changes (-), mucositis (-), gum bleeding (-), epistaxis (-) Cardiovascular: chest pain (-), palpitations (-) Pulmonary: shortness of breath (-), dyspnea on exertion (-), cough (-), hemoptysis (-) GI:  Early satiety (-), melena (-), dysphagia (-), nausea/vomiting (-), diarrhea (-) GU: dysuria (-), hematuria (-), incontinence (-) Musculoskeletal: joint swelling (-), joint pain (-), back pain (-) Neuro: weakness (-),  numbness (-), headache (-), confusion (-) Skin: Rash (-), lesions (-), dryness (-) Psych: depression (-), suicidal/homicidal ideation (-), feeling of hopelessness (-)   PHYSICAL EXAM BP 108/73  Pulse 81  Temp(Src) 98 F (36.7 C) (Oral)  Resp 20  Ht 4\' 11"  (1.499 m)  Wt 172 lb (78.019 kg)  BMI 34.72 kg/m2 General: Patient is a well appearing female in no acute distress HEENT: PERRLA, sclerae anicteric no conjunctival pallor, MMM Neck: supple, no palpable adenopathy Lungs: clear to auscultation bilaterally, no wheezes, rhonchi, or rales Cardiovascular: regular rate rhythm, S1, S2, no murmurs, rubs or gallops Abdomen: Soft, non-tender, non-distended, normoactive bowel sounds, no HSM Extremities: warm and well perfused, no clubbing, cyanosis, or edema Skin: No rashes or lesions Neuro: Non-focal ECOG PERFORMANCE STATUS: 1 - Symptomatic but completely ambulatory  LABORATORY DATA: Lab Results  Component Value Date   WBC 7.1 01/29/2013   HGB 13.6 01/29/2013   HCT 38.7 01/29/2013   MCV 88.0 01/29/2013   PLT 262 01/29/2013      Chemistry      Component Value Date/Time   NA 139 12/15/2012 0856   NA 140 08/24/2011 0908   K 3.6 12/15/2012 0856   K 3.9 08/24/2011 0908   CL 106 12/15/2012 0856   CL 108 08/24/2011 0908   CO2 23 12/15/2012 0856   CO2 21 08/24/2011 0908   BUN 10.1 12/15/2012 0856   BUN 9 08/24/2011 0908   CREATININE 0.7 12/15/2012 0856   CREATININE 0.60 08/24/2011 0908  Component Value Date/Time   CALCIUM 9.2 12/15/2012 0856   CALCIUM 8.9 08/24/2011 0908   ALKPHOS 86 12/15/2012 0856   ALKPHOS 70 08/24/2011 0908   AST 10 12/15/2012 0856   AST 10 08/24/2011 0908   ALT 12 12/15/2012 0856   ALT 11 08/24/2011 0908   BILITOT 0.34 12/15/2012 0856   BILITOT 0.3 08/24/2011 0908       RADIOGRAPHIC STUDIES:  No results found.  ASSESSMENT: 42 year old female with:  1.  multifactorial anemia including B12 and iron deficiency. She has been getting replacements for both of them. She  is getting B12  Injections every 3 weeks.  2. Stus post IV iron  3. Panic attacks  4. Fatigue/chronic fatigue  PLAN:  1. We will check her iron studies today and f/u with her regarding whether or not she needs iron.    2. Continue B12 injections q 3 weeks  3.  We will see her back in 6 months.    All questions were answered. The patient knows to call the clinic with any problems, questions or concerns. We can certainly see the patient much sooner if necessary.  I spent 25 minutes counseling the patient face to face. The total time spent in the appointment was 30 minutes.  Cherie Ouch Lyn Hollingshead, NP Medical Oncology [facility] Phone: [phone] 01/29/2013, 1:03 PM

## 2013-02-15 ENCOUNTER — Other Ambulatory Visit: Payer: Self-pay | Admitting: Medical Oncology

## 2013-02-15 DIAGNOSIS — D649 Anemia, unspecified: Secondary | ICD-10-CM

## 2013-02-16 ENCOUNTER — Ambulatory Visit (HOSPITAL_BASED_OUTPATIENT_CLINIC_OR_DEPARTMENT_OTHER): Payer: BC Managed Care – PPO

## 2013-02-16 ENCOUNTER — Other Ambulatory Visit (HOSPITAL_BASED_OUTPATIENT_CLINIC_OR_DEPARTMENT_OTHER): Payer: BC Managed Care – PPO | Admitting: Lab

## 2013-02-16 VITALS — BP 101/58 | HR 75 | Temp 98.1°F

## 2013-02-16 DIAGNOSIS — D649 Anemia, unspecified: Secondary | ICD-10-CM

## 2013-02-16 DIAGNOSIS — E538 Deficiency of other specified B group vitamins: Secondary | ICD-10-CM

## 2013-02-16 LAB — CBC WITH DIFFERENTIAL/PLATELET
BASO%: 0.5 % (ref 0.0–2.0)
MCHC: 34.3 g/dL (ref 31.5–36.0)
MONO#: 0.6 10*3/uL (ref 0.1–0.9)
RBC: 4.49 10*6/uL (ref 3.70–5.45)
WBC: 8.1 10*3/uL (ref 3.9–10.3)
lymph#: 1.9 10*3/uL (ref 0.9–3.3)

## 2013-02-16 MED ORDER — CYANOCOBALAMIN 1000 MCG/ML IJ SOLN
1000.0000 ug | Freq: Once | INTRAMUSCULAR | Status: AC
  Administered 2013-02-16: 1000 ug via INTRAMUSCULAR

## 2013-03-08 ENCOUNTER — Other Ambulatory Visit: Payer: Self-pay | Admitting: Emergency Medicine

## 2013-03-08 DIAGNOSIS — D649 Anemia, unspecified: Secondary | ICD-10-CM

## 2013-03-09 ENCOUNTER — Other Ambulatory Visit: Payer: BC Managed Care – PPO | Admitting: Lab

## 2013-03-09 ENCOUNTER — Other Ambulatory Visit (HOSPITAL_BASED_OUTPATIENT_CLINIC_OR_DEPARTMENT_OTHER): Payer: BC Managed Care – PPO | Admitting: Lab

## 2013-03-09 ENCOUNTER — Ambulatory Visit (HOSPITAL_BASED_OUTPATIENT_CLINIC_OR_DEPARTMENT_OTHER): Payer: BC Managed Care – PPO

## 2013-03-09 VITALS — BP 112/64 | HR 70 | Temp 98.0°F

## 2013-03-09 DIAGNOSIS — D649 Anemia, unspecified: Secondary | ICD-10-CM

## 2013-03-09 DIAGNOSIS — E538 Deficiency of other specified B group vitamins: Secondary | ICD-10-CM

## 2013-03-09 LAB — CBC WITH DIFFERENTIAL/PLATELET
BASO%: 1 % (ref 0.0–2.0)
LYMPH%: 25.3 % (ref 14.0–49.7)
MCHC: 34.2 g/dL (ref 31.5–36.0)
MONO#: 0.6 10*3/uL (ref 0.1–0.9)
MONO%: 8.3 % (ref 0.0–14.0)
NEUT#: 4.3 10*3/uL (ref 1.5–6.5)
Platelets: 213 10*3/uL (ref 145–400)
RBC: 4.58 10*6/uL (ref 3.70–5.45)
RDW: 12.2 % (ref 11.2–14.5)
WBC: 7 10*3/uL (ref 3.9–10.3)

## 2013-03-09 MED ORDER — CYANOCOBALAMIN 1000 MCG/ML IJ SOLN
1000.0000 ug | Freq: Once | INTRAMUSCULAR | Status: AC
  Administered 2013-03-09: 1000 ug via INTRAMUSCULAR

## 2013-03-29 ENCOUNTER — Other Ambulatory Visit: Payer: Self-pay | Admitting: Medical Oncology

## 2013-03-29 DIAGNOSIS — D649 Anemia, unspecified: Secondary | ICD-10-CM

## 2013-03-30 ENCOUNTER — Ambulatory Visit (HOSPITAL_BASED_OUTPATIENT_CLINIC_OR_DEPARTMENT_OTHER): Payer: BC Managed Care – PPO

## 2013-03-30 ENCOUNTER — Other Ambulatory Visit (HOSPITAL_BASED_OUTPATIENT_CLINIC_OR_DEPARTMENT_OTHER): Payer: BC Managed Care – PPO | Admitting: Lab

## 2013-03-30 VITALS — BP 119/67 | HR 75 | Temp 97.8°F

## 2013-03-30 DIAGNOSIS — D649 Anemia, unspecified: Secondary | ICD-10-CM

## 2013-03-30 DIAGNOSIS — E538 Deficiency of other specified B group vitamins: Secondary | ICD-10-CM

## 2013-03-30 LAB — CBC WITH DIFFERENTIAL/PLATELET
BASO%: 0.8 % (ref 0.0–2.0)
Eosinophils Absolute: 0.2 10*3/uL (ref 0.0–0.5)
HCT: 40.2 % (ref 34.8–46.6)
MCHC: 33.8 g/dL (ref 31.5–36.0)
MONO#: 0.4 10*3/uL (ref 0.1–0.9)
NEUT#: 4.3 10*3/uL (ref 1.5–6.5)
RBC: 4.56 10*6/uL (ref 3.70–5.45)
WBC: 6.6 10*3/uL (ref 3.9–10.3)
lymph#: 1.6 10*3/uL (ref 0.9–3.3)

## 2013-03-30 LAB — IRON AND TIBC
%SAT: 44 % (ref 20–55)
Iron: 113 ug/dL (ref 42–145)
TIBC: 254 ug/dL (ref 250–470)

## 2013-03-30 MED ORDER — CYANOCOBALAMIN 1000 MCG/ML IJ SOLN
1000.0000 ug | Freq: Once | INTRAMUSCULAR | Status: AC
  Administered 2013-03-30: 1000 ug via INTRAMUSCULAR

## 2013-04-20 ENCOUNTER — Ambulatory Visit (HOSPITAL_BASED_OUTPATIENT_CLINIC_OR_DEPARTMENT_OTHER): Payer: BC Managed Care – PPO

## 2013-04-20 ENCOUNTER — Other Ambulatory Visit (HOSPITAL_BASED_OUTPATIENT_CLINIC_OR_DEPARTMENT_OTHER): Payer: BC Managed Care – PPO | Admitting: Lab

## 2013-04-20 ENCOUNTER — Other Ambulatory Visit: Payer: Self-pay | Admitting: Emergency Medicine

## 2013-04-20 VITALS — BP 105/60 | HR 71 | Temp 98.3°F

## 2013-04-20 DIAGNOSIS — D649 Anemia, unspecified: Secondary | ICD-10-CM

## 2013-04-20 DIAGNOSIS — E538 Deficiency of other specified B group vitamins: Secondary | ICD-10-CM

## 2013-04-20 LAB — CBC WITH DIFFERENTIAL/PLATELET
BASO%: 0.8 % (ref 0.0–2.0)
HCT: 39.1 % (ref 34.8–46.6)
LYMPH%: 24.1 % (ref 14.0–49.7)
MCH: 30.6 pg (ref 25.1–34.0)
MCHC: 34.6 g/dL (ref 31.5–36.0)
MONO#: 0.6 10*3/uL (ref 0.1–0.9)
NEUT%: 65.3 % (ref 38.4–76.8)
Platelets: 263 10*3/uL (ref 145–400)
WBC: 8.5 10*3/uL (ref 3.9–10.3)

## 2013-04-20 MED ORDER — CYANOCOBALAMIN 1000 MCG/ML IJ SOLN
1000.0000 ug | Freq: Once | INTRAMUSCULAR | Status: AC
  Administered 2013-04-20: 1000 ug via INTRAMUSCULAR

## 2013-05-10 ENCOUNTER — Other Ambulatory Visit: Payer: Self-pay | Admitting: Emergency Medicine

## 2013-05-10 DIAGNOSIS — D649 Anemia, unspecified: Secondary | ICD-10-CM

## 2013-05-11 ENCOUNTER — Ambulatory Visit (HOSPITAL_BASED_OUTPATIENT_CLINIC_OR_DEPARTMENT_OTHER): Payer: BC Managed Care – PPO

## 2013-05-11 ENCOUNTER — Other Ambulatory Visit (HOSPITAL_BASED_OUTPATIENT_CLINIC_OR_DEPARTMENT_OTHER): Payer: BC Managed Care – PPO | Admitting: Lab

## 2013-05-11 VITALS — BP 101/64 | HR 77 | Temp 98.2°F

## 2013-05-11 DIAGNOSIS — D649 Anemia, unspecified: Secondary | ICD-10-CM

## 2013-05-11 DIAGNOSIS — E538 Deficiency of other specified B group vitamins: Secondary | ICD-10-CM

## 2013-05-11 LAB — CBC WITH DIFFERENTIAL/PLATELET
BASO%: 0.5 % (ref 0.0–2.0)
EOS%: 1.1 % (ref 0.0–7.0)
HCT: 40 % (ref 34.8–46.6)
LYMPH%: 25.3 % (ref 14.0–49.7)
MCH: 30.3 pg (ref 25.1–34.0)
MCHC: 34.8 g/dL (ref 31.5–36.0)
MCV: 87.1 fL (ref 79.5–101.0)
MONO%: 8.1 % (ref 0.0–14.0)
NEUT%: 65 % (ref 38.4–76.8)
lymph#: 1.8 10*3/uL (ref 0.9–3.3)

## 2013-05-11 MED ORDER — CYANOCOBALAMIN 1000 MCG/ML IJ SOLN
1000.0000 ug | Freq: Once | INTRAMUSCULAR | Status: AC
  Administered 2013-05-11: 1000 ug via INTRAMUSCULAR

## 2013-05-13 NOTE — Progress Notes (Signed)
Quick Note:  Call patient: cbc looks goos ______

## 2013-05-14 ENCOUNTER — Telehealth: Payer: Self-pay | Admitting: Medical Oncology

## 2013-05-14 NOTE — Telephone Encounter (Signed)
Per MD, informed pt CBC lab results look good. Patient expressed thanks, no questions at this time.

## 2013-05-14 NOTE — Telephone Encounter (Signed)
Message copied by Rexene Edison on Mon May 14, 2013  3:26 PM ------      Message from: Victorino December      Created: Sun May 13, 2013 10:30 AM       Call patient: cbc looks goos ------

## 2013-06-01 ENCOUNTER — Other Ambulatory Visit: Payer: BC Managed Care – PPO | Admitting: Lab

## 2013-06-01 ENCOUNTER — Ambulatory Visit (HOSPITAL_BASED_OUTPATIENT_CLINIC_OR_DEPARTMENT_OTHER): Payer: BC Managed Care – PPO

## 2013-06-01 VITALS — BP 103/62 | HR 78 | Temp 98.0°F

## 2013-06-01 DIAGNOSIS — D649 Anemia, unspecified: Secondary | ICD-10-CM

## 2013-06-01 DIAGNOSIS — E538 Deficiency of other specified B group vitamins: Secondary | ICD-10-CM

## 2013-06-01 MED ORDER — CYANOCOBALAMIN 1000 MCG/ML IJ SOLN
1000.0000 ug | Freq: Once | INTRAMUSCULAR | Status: AC
  Administered 2013-06-01: 1000 ug via INTRAMUSCULAR

## 2013-06-22 ENCOUNTER — Ambulatory Visit (HOSPITAL_BASED_OUTPATIENT_CLINIC_OR_DEPARTMENT_OTHER): Payer: BC Managed Care – PPO

## 2013-06-22 ENCOUNTER — Ambulatory Visit: Payer: BC Managed Care – PPO

## 2013-06-22 ENCOUNTER — Other Ambulatory Visit: Payer: BC Managed Care – PPO | Admitting: Lab

## 2013-06-22 VITALS — BP 111/71 | HR 76 | Temp 98.0°F

## 2013-06-22 DIAGNOSIS — E538 Deficiency of other specified B group vitamins: Secondary | ICD-10-CM

## 2013-06-22 DIAGNOSIS — D649 Anemia, unspecified: Secondary | ICD-10-CM

## 2013-06-22 MED ORDER — CYANOCOBALAMIN 1000 MCG/ML IJ SOLN
1000.0000 ug | Freq: Once | INTRAMUSCULAR | Status: AC
  Administered 2013-06-22: 1000 ug via INTRAMUSCULAR

## 2013-07-13 ENCOUNTER — Other Ambulatory Visit: Payer: BC Managed Care – PPO | Admitting: Lab

## 2013-07-13 ENCOUNTER — Ambulatory Visit (HOSPITAL_BASED_OUTPATIENT_CLINIC_OR_DEPARTMENT_OTHER): Payer: BC Managed Care – PPO

## 2013-07-13 ENCOUNTER — Ambulatory Visit: Payer: BC Managed Care – PPO

## 2013-07-13 VITALS — BP 111/73 | HR 81 | Temp 98.3°F

## 2013-07-13 DIAGNOSIS — E538 Deficiency of other specified B group vitamins: Secondary | ICD-10-CM

## 2013-07-13 DIAGNOSIS — D649 Anemia, unspecified: Secondary | ICD-10-CM

## 2013-07-13 MED ORDER — CYANOCOBALAMIN 1000 MCG/ML IJ SOLN
1000.0000 ug | Freq: Once | INTRAMUSCULAR | Status: AC
  Administered 2013-07-13: 1000 ug via INTRAMUSCULAR

## 2013-08-03 ENCOUNTER — Ambulatory Visit: Payer: BC Managed Care – PPO

## 2013-08-03 ENCOUNTER — Other Ambulatory Visit (HOSPITAL_BASED_OUTPATIENT_CLINIC_OR_DEPARTMENT_OTHER): Payer: BC Managed Care – PPO | Admitting: Lab

## 2013-08-03 ENCOUNTER — Ambulatory Visit (HOSPITAL_BASED_OUTPATIENT_CLINIC_OR_DEPARTMENT_OTHER): Payer: BC Managed Care – PPO | Admitting: Oncology

## 2013-08-03 ENCOUNTER — Other Ambulatory Visit: Payer: Self-pay | Admitting: Emergency Medicine

## 2013-08-03 VITALS — BP 112/71 | HR 68 | Temp 98.5°F | Resp 18 | Ht 59.0 in | Wt 162.9 lb

## 2013-08-03 DIAGNOSIS — D649 Anemia, unspecified: Secondary | ICD-10-CM

## 2013-08-03 DIAGNOSIS — D508 Other iron deficiency anemias: Secondary | ICD-10-CM

## 2013-08-03 LAB — CBC WITH DIFFERENTIAL/PLATELET
BASO%: 0.4 % (ref 0.0–2.0)
MCHC: 34.3 g/dL (ref 31.5–36.0)
MONO#: 0.6 10*3/uL (ref 0.1–0.9)
RBC: 4.49 10*6/uL (ref 3.70–5.45)
WBC: 8 10*3/uL (ref 3.9–10.3)
lymph#: 1.7 10*3/uL (ref 0.9–3.3)

## 2013-08-03 LAB — FERRITIN CHCC: Ferritin: 221 ng/ml (ref 9–269)

## 2013-08-03 LAB — IRON AND TIBC CHCC
Iron: 105 ug/dL (ref 41–142)
TIBC: 248 ug/dL (ref 236–444)
UIBC: 143 ug/dL (ref 120–384)

## 2013-08-03 MED ORDER — VENLAFAXINE HCL ER 37.5 MG PO CP24: 37.5000 mg | ORAL_CAPSULE | Freq: Every day | ORAL | Status: DC

## 2013-08-03 MED ORDER — CYANOCOBALAMIN 1000 MCG/ML IJ SOLN
1000.0000 ug | Freq: Once | INTRAMUSCULAR | Status: AC
  Administered 2013-08-03: 1000 ug via INTRAMUSCULAR

## 2013-08-03 MED ORDER — CYANOCOBALAMIN 1000 MCG/ML IJ SOLN
INTRAMUSCULAR | Status: AC
  Filled 2013-08-03: qty 1

## 2013-08-03 NOTE — Patient Instructions (Addendum)
You are doing well  We will continue B12 injections  We discussed anti-depressants consisting of effexor-xr 37.5 mg daily  Venlafaxine extended-release capsules What is this medicine? VENLAFAXINE (VEN la fax een) is used to treat depression, anxiety and panic disorder. This medicine may be used for other purposes; ask your health care provider or pharmacist if you have questions. What should I tell my health care provider before I take this medicine? They need to know if you have any of these conditions: -anorexia or weight loss -glaucoma -high blood pressure, heart problems or a recent heart attack -high cholesterol levels or receiving treatment for high cholesterol -kidney or liver disease -mania or bipolar disorder -seizures (convulsions) -suicidal thoughts or a previous suicide attempt -thyroid problems -an unusual or allergic reaction to venlafaxine, other medicines, foods, dyes, or preservatives -pregnant or trying to get pregnant -breast-feeding How should I use this medicine? Take this medicine by mouth with a full glass of water. Follow the directions on the prescription label. Do not cut, crush or chew this medicine. Take it with food. Try to take your medicine at about the same time each day. Do not take your medicine more often than directed. Do not stop taking this medicine suddenly except upon the advice of your doctor. Stopping this medicine too quickly may cause serious side effects or your condition may worsen. A special MedGuide will be given to you by the pharmacist with each prescription and refill. Be sure to read this information carefully each time. Talk to your pediatrician regarding the use of this medicine in children. Special care may be needed. Overdosage: If you think you have taken too much of this medicine contact a poison control center or emergency room at once. NOTE: This medicine is only for you. Do not share this medicine with others. What if I miss a  dose? If you miss a dose, take it as soon as you can. If it is almost time for your next dose, take only that dose. Do not take double or extra doses. What may interact with this medicine? Do not take this medicine with any of the following medications: -desvenlafaxine -duloxetine -linezolid -MAOIs like Azilect, Carbex, Eldepryl, Marplan, Nardil, and Parnate -medicines for weight control or appetite -methylene blue (injected into a vein) -nefazodone -procarbazine -tryptophan This medicine may also interact with the following medications: -amphetamine or dextroamphetamine -aspirin and aspirin-like medicines -cimetidine -clozapine -medicines for heart rhythm or blood pressure -medicines for migraine headache like almotriptan, eletriptan, frovatriptan, naratriptan, rizatriptan, sumatriptan, zolmitriptan -medicines that treat or prevent blood clots like warfarin, enoxaparin, and dalteparin -NSAIDS, medicines for pain and inflammation, like ibuprofen or naproxen -other medicines for depression, anxiety, or psychotic disturbances -ritonavir -St. John's wort -tramadol This list may not describe all possible interactions. Give your health care provider a list of all the medicines, herbs, non-prescription drugs, or dietary supplements you use. Also tell them if you smoke, drink alcohol, or use illegal drugs. Some items may interact with your medicine. What should I watch for while using this medicine? Tell your doctor if your symptoms do not get better or if they get worse. Visit your doctor or health care professional for regular checks on your progress. Because it may take several weeks to see the full effects of this medicine, it is important to continue your treatment as prescribed by your doctor. Patients and their families should watch out for new or worsening thoughts of suicide or depression. Also watch out for sudden changes in feelings  such as feeling anxious, agitated, panicky,  irritable, hostile, aggressive, impulsive, severely restless, overly excited and hyperactive, or not being able to sleep. If this happens, especially at the beginning of treatment or after a change in dose, call your health care professional. This medicine can cause an increase in blood pressure. Check with your doctor for instructions on monitoring your blood pressure while taking this medicine. You may get drowsy or dizzy. Do not drive, use machinery, or do anything that needs mental alertness until you know how this medicine affects you. Do not stand or sit up quickly, especially if you are an older patient. This reduces the risk of dizzy or fainting spells. Alcohol may interfere with the effect of this medicine. Avoid alcoholic drinks. Your mouth may get dry. Chewing sugarless gum, sucking hard candy and drinking plenty of water will help. Contact your doctor if the problem does not go away or is severe. What side effects may I notice from receiving this medicine? Side effects that you should report to your doctor or health care professional as soon as possible: -allergic reactions like skin rash, itching or hives, swelling of the face, lips, or tongue -breathing problems -changes in vision -hallucination, loss of contact with reality -seizures -suicidal thoughts or other mood changes -trouble passing urine or change in the amount of urine -unusual bleeding or bruising Side effects that usually do not require medical attention (report to your doctor or health care professional if they continue or are bothersome): -change in sex drive or performance -constipation -increased sweating -loss of appetite -nausea -tremors -weight loss This list may not describe all possible side effects. Call your doctor for medical advice about side effects. You may report side effects to FDA at 1-800-FDA-1088. Where should I keep my medicine? Keep out of the reach of children. Store at a controlled  temperature between 20 and 25 degrees C (68 degrees and 77 degrees F), in a dry place. Throw away any unused medicine after the expiration date. NOTE: This sheet is a summary. It may not cover all possible information. If you have questions about this medicine, talk to your doctor, pharmacist, or health care provider.  2013, [employer]. (03/24/2012 9:05:04 PM)

## 2013-08-06 ENCOUNTER — Telehealth: Payer: Self-pay | Admitting: Oncology

## 2013-08-07 NOTE — Progress Notes (Signed)
Quick Note:  Call patient: iron studies normal ______

## 2013-08-12 NOTE — Progress Notes (Signed)
OFFICE PROGRESS NOTE  CC Dr. Adrian Blackwater Dr. Jovita Gamma  DIAGNOSIS: 42 year old female with multifactorial anemia patient has received B12 and iron infusions in the past.  PRIOR THERAPY:  #1 patient has been receiving B12 injections every 3 weeks.  #2 S/p IV iron in February 2013  CURRENT THERAPY:Patient will receive B12 injections every 2 weeks to see if she has improvement in her symptoms.  INTERVAL HISTORY: Jenny Griffin 42 y.o. female returns for Followup visit today.  She continues to have fatigue about a week and a half after she receives her B12 injection, until her next injection.  Also, her stomach hurts, and this usually happens when her iron is low.  She is very fatigued and just wants to feel better. Patient is also undergoing quite stressful times as she is gong to be divorced and possible job loss Otherwise, a 10 point ROS is neg.   MEDICAL HISTORY: Past Medical History  Diagnosis Date  . Anemia   . Anxiety   . Panic attacks january 2013    ALLERGIES:  has No Known Allergies.  MEDICATIONS:  Current Outpatient Prescriptions  Medication Sig Dispense Refill  . ondansetron (ZOFRAN) 4 MG tablet TAKE 1 TABLET BY MOUTH EVERY 4 HOURS AS NEEDED FOR NAUSEA AND VOMITING  30 tablet  3  . ondansetron (ZOFRAN-ODT) 4 MG disintegrating tablet       . venlafaxine XR (EFFEXOR-XR) 37.5 MG 24 hr capsule Take 1 capsule (37.5 mg total) by mouth daily.  30 capsule  6   No current facility-administered medications for this visit.    SURGICAL HISTORY:  Past Surgical History  Procedure Laterality Date  . Cesarean section    . Right oophorectomy      REVIEW OF SYSTEMS:   General: fatigue (+), night sweats (-), fever (-), pain (-) Lymph: palpable nodes (-) HEENT: vision changes (-), mucositis (-), gum bleeding (-), epistaxis (-) Cardiovascular: chest pain (-), palpitations (-) Pulmonary: shortness of breath (-), dyspnea on exertion (-), cough (-), hemoptysis (-) GI:   Early satiety (-), melena (-), dysphagia (-), nausea/vomiting (-), diarrhea (-) GU: dysuria (-), hematuria (-), incontinence (-) Musculoskeletal: joint swelling (-), joint pain (-), back pain (-) Neuro: weakness (-), numbness (-), headache (-), confusion (-) Skin: Rash (-), lesions (-), dryness (-) Psych: depression (-), suicidal/homicidal ideation (-), feeling of hopelessness (-)   PHYSICAL EXAM BP 112/71  Pulse 68  Temp(Src) 98.5 F (36.9 C) (Oral)  Resp 18  Ht 4\' 11"  (1.499 m)  Wt 162 lb 14.4 oz (73.891 kg)  BMI 32.88 kg/m2 General: Patient is a well appearing female in no acute distress HEENT: PERRLA, sclerae anicteric no conjunctival pallor, MMM Neck: supple, no palpable adenopathy Lungs: clear to auscultation bilaterally, no wheezes, rhonchi, or rales Cardiovascular: regular rate rhythm, S1, S2, no murmurs, rubs or gallops Abdomen: Soft, non-tender, non-distended, normoactive bowel sounds, no HSM Extremities: warm and well perfused, no clubbing, cyanosis, or edema Skin: No rashes or lesions Neuro: Non-focal ECOG PERFORMANCE STATUS: 1 - Symptomatic but completely ambulatory  LABORATORY DATA: Lab Results  Component Value Date   WBC 8.0 08/03/2013   HGB 13.5 08/03/2013   HCT 39.4 08/03/2013   MCV 87.8 08/03/2013   PLT 257 08/03/2013      Chemistry      Component Value Date/Time   NA 139 12/15/2012 0856   NA 140 08/24/2011 0908   K 3.6 12/15/2012 0856   K 3.9 08/24/2011 0908   CL 106 12/15/2012 0856  CL 108 08/24/2011 0908   CO2 23 12/15/2012 0856   CO2 21 08/24/2011 0908   BUN 10.1 12/15/2012 0856   BUN 9 08/24/2011 0908   CREATININE 0.7 12/15/2012 0856   CREATININE 0.60 08/24/2011 0908      Component Value Date/Time   CALCIUM 9.2 12/15/2012 0856   CALCIUM 8.9 08/24/2011 0908   ALKPHOS 86 12/15/2012 0856   ALKPHOS 70 08/24/2011 0908   AST 10 12/15/2012 0856   AST 10 08/24/2011 0908   ALT 12 12/15/2012 0856   ALT 11 08/24/2011 0908   BILITOT 0.34 12/15/2012 0856   BILITOT 0.3  08/24/2011 0908       RADIOGRAPHIC STUDIES:  No results found.  ASSESSMENT: 42 year old female with:  1.  multifactorial anemia including B12 and iron deficiency. She has been getting replacements for both of them. She is getting B12  Injections every 3 weeks.  2. Stus post IV iron  3. Panic attacks  4. Fatigue/chronic fatigue  PLAN:  1. We will check her iron studies today and f/u with her regarding whether or not she needs iron.    2. Continue B12 injections q 3 weeks  3.  We will see her back in 6 months.    #4 patient was given a prescription for Effexor for anxiety  All questions were answered. The patient knows to call the clinic with any problems, questions or concerns. We can certainly see the patient much sooner if necessary.  I spent 25 minutes counseling the patient face to face. The total time spent in the appointment was 30 minutes.  Drue Second, MD Medical/Oncology [facility] [phone] (beeper) [phone] (Office)

## 2013-08-13 ENCOUNTER — Telehealth: Payer: Self-pay | Admitting: Medical Oncology

## 2013-08-13 NOTE — Telephone Encounter (Signed)
Message copied by Rexene Edison on Mon Aug 13, 2013 10:30 AM ------      Message from: Jenny Griffin      Created: Tue Aug 07, 2013  4:53 PM       Call patient: iron studies normal ------

## 2013-08-13 NOTE — Telephone Encounter (Signed)
Per MD, informed patient results from iron studies normal. Patient expressed thanks, denies questions at this time. Encouraged patient to call office should she have any questions or concerns.

## 2013-08-24 ENCOUNTER — Ambulatory Visit (HOSPITAL_BASED_OUTPATIENT_CLINIC_OR_DEPARTMENT_OTHER): Payer: BC Managed Care – PPO

## 2013-08-24 VITALS — BP 105/62 | HR 81 | Temp 98.2°F | Wt 161.1 lb

## 2013-08-24 DIAGNOSIS — D649 Anemia, unspecified: Secondary | ICD-10-CM

## 2013-08-24 DIAGNOSIS — E538 Deficiency of other specified B group vitamins: Secondary | ICD-10-CM

## 2013-08-24 MED ORDER — CYANOCOBALAMIN 1000 MCG/ML IJ SOLN
1000.0000 ug | Freq: Once | INTRAMUSCULAR | Status: AC
  Administered 2013-08-24: 1000 ug via INTRAMUSCULAR

## 2013-09-14 ENCOUNTER — Ambulatory Visit (HOSPITAL_BASED_OUTPATIENT_CLINIC_OR_DEPARTMENT_OTHER): Payer: BC Managed Care – PPO

## 2013-09-14 VITALS — BP 118/66 | HR 82 | Temp 98.1°F

## 2013-09-14 DIAGNOSIS — E538 Deficiency of other specified B group vitamins: Secondary | ICD-10-CM

## 2013-09-14 DIAGNOSIS — D649 Anemia, unspecified: Secondary | ICD-10-CM

## 2013-09-14 MED ORDER — CYANOCOBALAMIN 1000 MCG/ML IJ SOLN
1000.0000 ug | Freq: Once | INTRAMUSCULAR | Status: AC
  Administered 2013-09-14: 1000 ug via INTRAMUSCULAR

## 2013-10-05 ENCOUNTER — Ambulatory Visit (HOSPITAL_BASED_OUTPATIENT_CLINIC_OR_DEPARTMENT_OTHER): Payer: BC Managed Care – PPO

## 2013-10-05 VITALS — BP 100/66 | HR 75 | Temp 97.5°F | Resp 18

## 2013-10-05 DIAGNOSIS — E538 Deficiency of other specified B group vitamins: Secondary | ICD-10-CM

## 2013-10-05 DIAGNOSIS — D649 Anemia, unspecified: Secondary | ICD-10-CM

## 2013-10-05 MED ORDER — CYANOCOBALAMIN 1000 MCG/ML IJ SOLN
1000.0000 ug | Freq: Once | INTRAMUSCULAR | Status: AC
  Administered 2013-10-05: 1000 ug via INTRAMUSCULAR

## 2013-10-22 ENCOUNTER — Telehealth: Payer: Self-pay | Admitting: Oncology

## 2013-10-22 NOTE — Telephone Encounter (Signed)
, °

## 2013-10-24 ENCOUNTER — Other Ambulatory Visit: Payer: BC Managed Care – PPO | Admitting: Lab

## 2013-10-24 ENCOUNTER — Ambulatory Visit: Payer: BC Managed Care – PPO

## 2013-10-24 ENCOUNTER — Ambulatory Visit: Payer: BC Managed Care – PPO | Admitting: Oncology

## 2013-10-26 ENCOUNTER — Other Ambulatory Visit (HOSPITAL_BASED_OUTPATIENT_CLINIC_OR_DEPARTMENT_OTHER): Payer: Self-pay | Admitting: Lab

## 2013-10-26 ENCOUNTER — Ambulatory Visit (HOSPITAL_BASED_OUTPATIENT_CLINIC_OR_DEPARTMENT_OTHER): Payer: Self-pay

## 2013-10-26 VITALS — BP 112/70 | HR 73 | Temp 98.1°F

## 2013-10-26 DIAGNOSIS — E538 Deficiency of other specified B group vitamins: Secondary | ICD-10-CM

## 2013-10-26 DIAGNOSIS — D509 Iron deficiency anemia, unspecified: Secondary | ICD-10-CM

## 2013-10-26 DIAGNOSIS — D508 Other iron deficiency anemias: Secondary | ICD-10-CM

## 2013-10-26 DIAGNOSIS — D649 Anemia, unspecified: Secondary | ICD-10-CM

## 2013-10-26 LAB — CBC WITH DIFFERENTIAL/PLATELET
BASO%: 1.3 % (ref 0.0–2.0)
EOS%: 4 % (ref 0.0–7.0)
MCH: 30.7 pg (ref 25.1–34.0)
MCV: 88.8 fL (ref 79.5–101.0)
MONO%: 6.8 % (ref 0.0–14.0)
RBC: 4.56 10*6/uL (ref 3.70–5.45)
RDW: 12.2 % (ref 11.2–14.5)
lymph#: 1.9 10*3/uL (ref 0.9–3.3)

## 2013-10-26 LAB — FERRITIN CHCC: Ferritin: 189 ng/ml (ref 9–269)

## 2013-10-26 LAB — IRON AND TIBC CHCC: UIBC: 203 ug/dL (ref 120–384)

## 2013-10-26 MED ORDER — CYANOCOBALAMIN 1000 MCG/ML IJ SOLN
1000.0000 ug | Freq: Once | INTRAMUSCULAR | Status: AC
Start: 2013-10-26 — End: 2013-10-26
  Administered 2013-10-26: 1000 ug via INTRAMUSCULAR

## 2013-11-13 ENCOUNTER — Telehealth: Payer: Self-pay | Admitting: Oncology

## 2013-11-13 NOTE — Telephone Encounter (Signed)
, °

## 2013-11-14 ENCOUNTER — Ambulatory Visit: Payer: BC Managed Care – PPO

## 2013-11-16 ENCOUNTER — Ambulatory Visit (HOSPITAL_BASED_OUTPATIENT_CLINIC_OR_DEPARTMENT_OTHER): Payer: Self-pay

## 2013-11-16 VITALS — BP 111/69 | HR 74 | Temp 98.2°F

## 2013-11-16 DIAGNOSIS — E538 Deficiency of other specified B group vitamins: Secondary | ICD-10-CM

## 2013-11-16 DIAGNOSIS — D649 Anemia, unspecified: Secondary | ICD-10-CM

## 2013-11-16 MED ORDER — CYANOCOBALAMIN 1000 MCG/ML IJ SOLN
1000.0000 ug | Freq: Once | INTRAMUSCULAR | Status: AC
  Administered 2013-11-16: 1000 ug via INTRAMUSCULAR

## 2013-12-05 ENCOUNTER — Ambulatory Visit: Payer: BC Managed Care – PPO

## 2013-12-07 ENCOUNTER — Encounter: Payer: Self-pay | Admitting: Adult Health

## 2013-12-07 ENCOUNTER — Ambulatory Visit (HOSPITAL_BASED_OUTPATIENT_CLINIC_OR_DEPARTMENT_OTHER): Payer: Self-pay

## 2013-12-07 ENCOUNTER — Encounter (INDEPENDENT_AMBULATORY_CARE_PROVIDER_SITE_OTHER): Payer: Self-pay

## 2013-12-07 ENCOUNTER — Telehealth: Payer: Self-pay | Admitting: *Deleted

## 2013-12-07 VITALS — BP 118/71 | HR 75 | Temp 98.0°F

## 2013-12-07 DIAGNOSIS — E538 Deficiency of other specified B group vitamins: Secondary | ICD-10-CM

## 2013-12-07 DIAGNOSIS — D649 Anemia, unspecified: Secondary | ICD-10-CM

## 2013-12-07 MED ORDER — CYANOCOBALAMIN 1000 MCG/ML IJ SOLN
1000.0000 ug | Freq: Once | INTRAMUSCULAR | Status: AC
  Administered 2013-12-07: 1000 ug via INTRAMUSCULAR

## 2013-12-07 MED ORDER — CYANOCOBALAMIN 1000 MCG/ML IJ SOLN
INTRAMUSCULAR | Status: AC
  Filled 2013-12-07: qty 1

## 2013-12-07 NOTE — Telephone Encounter (Signed)
Jenny Griffin called to let us know that she was stuck in traffic in [place] and would be late.  She is scheduled to see Mendel Ryder, NP and receive an injection today.  I told her we would have to see what we could do when she got here.  We may have to reschedule her office visit and/or injection.  Told her be safe while driving.

## 2013-12-09 NOTE — Progress Notes (Signed)
This encounter was created in error - please disregard.

## 2013-12-10 ENCOUNTER — Telehealth: Payer: Self-pay | Admitting: Oncology

## 2013-12-10 ENCOUNTER — Encounter: Payer: Self-pay | Admitting: Oncology

## 2013-12-10 NOTE — Progress Notes (Signed)
Called and left a message for patient to call me back. She is uninsured so I need to check if she has applied for any insurance.

## 2013-12-10 NOTE — Telephone Encounter (Signed)
lmonvm for pt re appt for 1/30. schedule mailed.

## 2013-12-21 ENCOUNTER — Encounter: Payer: Self-pay | Admitting: Emergency Medicine

## 2013-12-21 ENCOUNTER — Ambulatory Visit (HOSPITAL_BASED_OUTPATIENT_CLINIC_OR_DEPARTMENT_OTHER): Payer: Self-pay | Admitting: Adult Health

## 2013-12-21 ENCOUNTER — Encounter: Payer: Self-pay | Admitting: Adult Health

## 2013-12-21 ENCOUNTER — Ambulatory Visit (HOSPITAL_BASED_OUTPATIENT_CLINIC_OR_DEPARTMENT_OTHER): Payer: Self-pay

## 2013-12-21 VITALS — BP 108/70 | HR 76 | Temp 98.8°F | Resp 18 | Ht 59.0 in | Wt 160.2 lb

## 2013-12-21 DIAGNOSIS — D509 Iron deficiency anemia, unspecified: Secondary | ICD-10-CM

## 2013-12-21 DIAGNOSIS — D649 Anemia, unspecified: Secondary | ICD-10-CM

## 2013-12-21 DIAGNOSIS — D518 Other vitamin B12 deficiency anemias: Secondary | ICD-10-CM

## 2013-12-21 DIAGNOSIS — R5382 Chronic fatigue, unspecified: Secondary | ICD-10-CM

## 2013-12-21 DIAGNOSIS — G9332 Myalgic encephalomyelitis/chronic fatigue syndrome: Secondary | ICD-10-CM

## 2013-12-21 LAB — CBC WITH DIFFERENTIAL/PLATELET
BASO%: 1 % (ref 0.0–2.0)
Basophils Absolute: 0.1 10*3/uL (ref 0.0–0.1)
EOS%: 2.4 % (ref 0.0–7.0)
Eosinophils Absolute: 0.2 10*3/uL (ref 0.0–0.5)
HCT: 42.2 % (ref 34.8–46.6)
HGB: 14.2 g/dL (ref 11.6–15.9)
LYMPH#: 2 10*3/uL (ref 0.9–3.3)
LYMPH%: 22.9 % (ref 14.0–49.7)
MCH: 30.2 pg (ref 25.1–34.0)
MCHC: 33.8 g/dL (ref 31.5–36.0)
MCV: 89.6 fL (ref 79.5–101.0)
MONO#: 0.6 10*3/uL (ref 0.1–0.9)
MONO%: 6.6 % (ref 0.0–14.0)
NEUT#: 5.7 10*3/uL (ref 1.5–6.5)
NEUT%: 67.1 % (ref 38.4–76.8)
Platelets: 271 10*3/uL (ref 145–400)
RBC: 4.71 10*6/uL (ref 3.70–5.45)
RDW: 12.3 % (ref 11.2–14.5)
WBC: 8.6 10*3/uL (ref 3.9–10.3)

## 2013-12-21 LAB — COMPREHENSIVE METABOLIC PANEL (CC13)
ALT: 15 U/L (ref 0–55)
ANION GAP: 10 meq/L (ref 3–11)
AST: 10 U/L (ref 5–34)
Albumin: 4.1 g/dL (ref 3.5–5.0)
Alkaline Phosphatase: 81 U/L (ref 40–150)
BUN: 7.9 mg/dL (ref 7.0–26.0)
CHLORIDE: 106 meq/L (ref 98–109)
CO2: 24 meq/L (ref 22–29)
Calcium: 9.2 mg/dL (ref 8.4–10.4)
Creatinine: 0.7 mg/dL (ref 0.6–1.1)
Glucose: 90 mg/dl (ref 70–140)
Potassium: 3.7 mEq/L (ref 3.5–5.1)
Sodium: 140 mEq/L (ref 136–145)
Total Bilirubin: 0.42 mg/dL (ref 0.20–1.20)
Total Protein: 7.9 g/dL (ref 6.4–8.3)

## 2013-12-21 LAB — VITAMIN B12: Vitamin B-12: 726 pg/mL (ref 211–911)

## 2013-12-21 LAB — FOLATE: Folate: >20 ng/mL

## 2013-12-21 MED ORDER — CYANOCOBALAMIN 1000 MCG/ML IJ SOLN: 1000.0000 ug | INTRAMUSCULAR | Status: DC

## 2013-12-21 NOTE — Progress Notes (Signed)
OFFICE PROGRESS NOTE  CC Dr. Neoma Laming Dr. Vella Kohler  DIAGNOSIS: 43 year old female with multifactorial anemia patient has received B12 and iron infusions in the past.  PRIOR THERAPY:  #1 patient has been receiving B12 injections every 3 weeks.  #2 S/p IV iron in February 2013  CURRENT THERAPY:Patient will receive B12 injections every 3 weeks  INTERVAL HISTORY: Jenny Griffin 43 y.o. female returns for Followup visit today.  She is doing moderately well.  She continues to be fatigued, but otherwise is without any major complaints.  She would like to start receiving her B12 injections at home, and has learned how to inject these.  She denies any fevers, chills, recent infections, shortness of breath, DOE, chest pain, or any further concerns.    MEDICAL HISTORY: Past Medical History  Diagnosis Date  . Anemia   . Anxiety   . Panic attacks january 2013    ALLERGIES:  has No Known Allergies.  MEDICATIONS:  Current Outpatient Prescriptions  Medication Sig Dispense Refill  . ondansetron (ZOFRAN-ODT) 4 MG disintegrating tablet       . cyanocobalamin (,VITAMIN B-12,) 1000 MCG/ML injection Inject 1 mL (1,000 mcg total) into the muscle every 21 ( twenty-one) days.  5 mL  4  . ondansetron (ZOFRAN) 4 MG tablet TAKE 1 TABLET BY MOUTH EVERY 4 HOURS AS NEEDED FOR NAUSEA AND VOMITING  30 tablet  3  . venlafaxine XR (EFFEXOR-XR) 37.5 MG 24 hr capsule Take 1 capsule (37.5 mg total) by mouth daily.  30 capsule  6   No current facility-administered medications for this visit.    SURGICAL HISTORY:  Past Surgical History  Procedure Laterality Date  . Cesarean section    . Right oophorectomy      REVIEW OF SYSTEMS:   A 10 point review of systems was conducted and is otherwise negative except for what is noted above.    PHYSICAL EXAM BP 108/70  Pulse 76  Temp(Src) 98.8 F (37.1 C) (Oral)  Resp 18  Ht 4\' 11"  (1.499 m)  Wt 160 lb 3.2 oz (72.666 kg)  BMI 32.34 kg/m2 GENERAL:  Patient is a well appearing female in no acute distress HEENT:  Sclerae anicteric.  Oropharynx clear and moist. No ulcerations or evidence of oropharyngeal candidiasis. Neck is supple.  NODES:  No cervical, supraclavicular, or axillary lymphadenopathy palpated.  LUNGS:  Clear to auscultation bilaterally.  No wheezes or rhonchi. HEART:  Regular rate and rhythm. No murmur appreciated. ABDOMEN:  Soft, nontender.  Positive, normoactive bowel sounds. No organomegaly palpated. MSK:  No focal spinal tenderness to palpation. Full range of motion bilaterally in the upper extremities. EXTREMITIES:  No peripheral edema.   SKIN:  Clear with no obvious rashes or skin changes. No nail dyscrasia. NEURO:  Nonfocal. Well oriented.  Appropriate affect. ECOG PERFORMANCE STATUS: 1 - Symptomatic but completely ambulatory  LABORATORY DATA: Lab Results  Component Value Date   WBC 8.6 12/21/2013   HGB 14.2 12/21/2013   HCT 42.2 12/21/2013   MCV 89.6 12/21/2013   PLT 271 12/21/2013      Chemistry      Component Value Date/Time   NA 140 12/21/2013 1529   NA 140 08/24/2011 0908   K 3.7 12/21/2013 1529   K 3.9 08/24/2011 0908   CL 106 12/15/2012 0856   CL 108 08/24/2011 0908   CO2 24 12/21/2013 1529   CO2 21 08/24/2011 0908   BUN 7.9 12/21/2013 1529   BUN 9 08/24/2011 0908  CREATININE 0.7 12/21/2013 1529   CREATININE 0.60 08/24/2011 0908      Component Value Date/Time   CALCIUM 9.2 12/21/2013 1529   CALCIUM 8.9 08/24/2011 0908   ALKPHOS 81 12/21/2013 1529   ALKPHOS 70 08/24/2011 0908   AST 10 12/21/2013 1529   AST 10 08/24/2011 0908   ALT 15 12/21/2013 1529   ALT 11 08/24/2011 0908   BILITOT 0.42 12/21/2013 1529   BILITOT 0.3 08/24/2011 0908       RADIOGRAPHIC STUDIES:  No results found.  ASSESSMENT: 43 year old female with:  1.  multifactorial anemia including B12 and iron deficiency. She has been getting replacements for both of them. She is getting B12  Injections every 3 weeks.  2. Stus post IV iron  3.  Panic attacks  4. Fatigue/chronic fatigue  PLAN:  1. Patient is doing relatively well today.  I ordered a CBC, CMET, iron studies, B12 level to be drawn after today's appointment.  We will call her with the results.      2. Continue B12 injections q 3 weeks.  She will have a friend inject or self inject these.  [place] verbalized understanding of this process.    3.  We will see her back in 6 months for labs and follow up.    All questions were answered. The patient knows to call the clinic with any problems, questions or concerns. We can certainly see the patient much sooner if necessary.  I spent 25 minutes counseling the patient face to face. The total time spent in the appointment was 30 minutes.  Minette Headland, [place] [phone]

## 2013-12-24 LAB — IRON AND TIBC CHCC
%SAT: 17 % — AB (ref 21–57)
IRON: 44 ug/dL (ref 41–142)
TIBC: 260 ug/dL (ref 236–444)
UIBC: 216 ug/dL (ref 120–384)

## 2013-12-24 LAB — FERRITIN CHCC: Ferritin: 155 ng/ml (ref 9–269)

## 2013-12-26 ENCOUNTER — Ambulatory Visit: Payer: BC Managed Care – PPO

## 2013-12-31 ENCOUNTER — Telehealth: Payer: Self-pay | Admitting: *Deleted

## 2013-12-31 NOTE — Telephone Encounter (Signed)
Called pt to inform her of lab results. No answer but left a detailed message on answering service. If pt has any questions she can call me at [phone]. Message to be forwarded to Charlestine Massed, NP.

## 2014-01-16 ENCOUNTER — Ambulatory Visit: Payer: BC Managed Care – PPO

## 2014-01-24 ENCOUNTER — Other Ambulatory Visit: Payer: BC Managed Care – PPO | Admitting: Lab

## 2014-01-24 ENCOUNTER — Ambulatory Visit: Payer: BC Managed Care – PPO | Admitting: Oncology

## 2014-02-06 ENCOUNTER — Ambulatory Visit: Payer: Self-pay | Admitting: Oncology

## 2014-02-06 ENCOUNTER — Ambulatory Visit: Payer: BC Managed Care – PPO

## 2014-02-06 ENCOUNTER — Other Ambulatory Visit: Payer: BC Managed Care – PPO

## 2014-02-27 ENCOUNTER — Ambulatory Visit: Payer: BC Managed Care – PPO

## 2014-03-28 ENCOUNTER — Other Ambulatory Visit: Payer: Self-pay | Admitting: *Deleted

## 2014-03-28 DIAGNOSIS — D649 Anemia, unspecified: Secondary | ICD-10-CM

## 2014-03-29 ENCOUNTER — Ambulatory Visit (HOSPITAL_BASED_OUTPATIENT_CLINIC_OR_DEPARTMENT_OTHER): Payer: Self-pay

## 2014-03-29 ENCOUNTER — Ambulatory Visit: Payer: Self-pay | Admitting: Adult Health

## 2014-03-29 ENCOUNTER — Other Ambulatory Visit: Payer: Self-pay

## 2014-03-29 ENCOUNTER — Other Ambulatory Visit: Payer: Self-pay | Admitting: Adult Health

## 2014-03-29 ENCOUNTER — Other Ambulatory Visit (HOSPITAL_BASED_OUTPATIENT_CLINIC_OR_DEPARTMENT_OTHER): Payer: Self-pay

## 2014-03-29 VITALS — BP 93/57 | HR 69 | Temp 97.1°F | Wt 157.2 lb

## 2014-03-29 DIAGNOSIS — E538 Deficiency of other specified B group vitamins: Secondary | ICD-10-CM

## 2014-03-29 DIAGNOSIS — D649 Anemia, unspecified: Secondary | ICD-10-CM

## 2014-03-29 DIAGNOSIS — D509 Iron deficiency anemia, unspecified: Secondary | ICD-10-CM

## 2014-03-29 LAB — COMPREHENSIVE METABOLIC PANEL (CC13)
ALT: 15 U/L (ref 0–55)
AST: 11 U/L (ref 5–34)
Albumin: 3.9 g/dL (ref 3.5–5.0)
Alkaline Phosphatase: 85 U/L (ref 40–150)
Anion Gap: 10 mEq/L (ref 3–11)
BUN: 9.9 mg/dL (ref 7.0–26.0)
CO2: 23 mEq/L (ref 22–29)
CREATININE: 0.7 mg/dL (ref 0.6–1.1)
Calcium: 9.3 mg/dL (ref 8.4–10.4)
Chloride: 108 mEq/L (ref 98–109)
Glucose: 100 mg/dl (ref 70–140)
Potassium: 3.6 mEq/L (ref 3.5–5.1)
Sodium: 141 mEq/L (ref 136–145)
Total Bilirubin: 0.28 mg/dL (ref 0.20–1.20)
Total Protein: 7.7 g/dL (ref 6.4–8.3)

## 2014-03-29 LAB — CBC WITH DIFFERENTIAL/PLATELET
BASO%: 1.1 % (ref 0.0–2.0)
Basophils Absolute: 0.1 10*3/uL (ref 0.0–0.1)
EOS%: 5.1 % (ref 0.0–7.0)
Eosinophils Absolute: 0.4 10*3/uL (ref 0.0–0.5)
HEMATOCRIT: 38.7 % (ref 34.8–46.6)
HGB: 13.1 g/dL (ref 11.6–15.9)
LYMPH%: 27.4 % (ref 14.0–49.7)
MCH: 29.8 pg (ref 25.1–34.0)
MCHC: 33.8 g/dL (ref 31.5–36.0)
MCV: 88.2 fL (ref 79.5–101.0)
MONO#: 0.5 10*3/uL (ref 0.1–0.9)
MONO%: 7 % (ref 0.0–14.0)
NEUT#: 4.3 10*3/uL (ref 1.5–6.5)
NEUT%: 59.4 % (ref 38.4–76.8)
PLATELETS: 266 10*3/uL (ref 145–400)
RBC: 4.39 10*6/uL (ref 3.70–5.45)
RDW: 12.3 % (ref 11.2–14.5)
WBC: 7.2 10*3/uL (ref 3.9–10.3)
lymph#: 2 10*3/uL (ref 0.9–3.3)

## 2014-03-29 LAB — VITAMIN B12: VITAMIN B 12: 613 pg/mL (ref 211–911)

## 2014-03-29 MED ORDER — CYANOCOBALAMIN 1000 MCG/ML IJ SOLN: 1000.0000 ug | INTRAMUSCULAR | Status: DC

## 2014-03-29 MED ORDER — CYANOCOBALAMIN 1000 MCG/ML IJ SOLN
1000.0000 ug | Freq: Once | INTRAMUSCULAR | Status: AC
  Administered 2014-03-29: 1000 ug via INTRAMUSCULAR

## 2014-04-01 ENCOUNTER — Telehealth: Payer: Self-pay | Admitting: *Deleted

## 2014-04-01 LAB — IRON AND TIBC CHCC
%SAT: 16 % — AB (ref 21–57)
Iron: 40 ug/dL — ABNORMAL LOW (ref 41–142)
TIBC: 256 ug/dL (ref 236–444)
UIBC: 216 ug/dL (ref 120–384)

## 2014-04-01 LAB — FERRITIN CHCC: Ferritin: 123 ng/ml (ref 9–269)

## 2014-04-01 NOTE — Telephone Encounter (Signed)
Called pt of  inform of Lab results. Pt's iron level is slightly low(40). Pt has complaints  of nausea and more fatigue than usual. Pt states, " Lately, I've been feeling like crap". Other labs were WNL. Message to  be forwarded  To Charlestine Massed, NP.

## 2014-06-17 ENCOUNTER — Telehealth: Payer: Self-pay | Admitting: *Deleted

## 2014-06-17 DIAGNOSIS — D649 Anemia, unspecified: Secondary | ICD-10-CM

## 2014-06-17 NOTE — Telephone Encounter (Signed)
Call received from patient requesting an "appointment tomorrow at 3:00 pm with Dr. Laurelyn Sickle assistant because I live in [place].  I need my numbers checked".  Reports "shortness of breath like I just can't get enough air in.  Difficulty whether I sit up or lie down.  Stomach cramps, nausea, legs and arms ache, complete pressure on my chest since 1:00 am.  Last night after an hour and a half my arms and legs went numb for 45 minutes like a stroke or heart attack but I know this wasn't happening."   Denies primary Care provider.  Followed her for anemia. Instructed to go to the  ER.  "No I am not going to the ER not now.. My husband told me this last night and I told him no.  If it gets worse I may go.  I am with my mother at a doctor's appointment."  Informed her these could be warnings and if it gets worse it could be too late.  Asked for "someone to call her with an appointment for tomorrow anyway just in case even if I do go to the ER".  Again instructed to go to ER and not to wait for a return call.  Can be reached at [phone].  Will notify providers of call.     1602 Now note patient seen last in January 2015 with no pending appointments.

## 2014-06-17 NOTE — Telephone Encounter (Signed)
Verbal order received and read back from Selena Lesser NP for a morning cbc-diff, CMET and visit.  Called patient.  Voicemail left with this information asking that she call and leave a message tonight with call a nurse or call office at 8:30am to be seen by 11:30 am at the latest.  If she is having chest pressure in am we will send her to the [facility].

## 2014-06-18 NOTE — Telephone Encounter (Signed)
Called today.  Voicemail left requesting return call for update, try f/u today with lab (iron studies, B-12 level, cbc, cmet).  If went to ER, needs to schedule future f./u due to most recent appointments were canceled in May with Home B12 injections.  Asked for address change.  Yesterday said she now lives in [place].C.

## 2014-06-18 NOTE — Telephone Encounter (Signed)
Anu has iron deficiency, and we generally check routine iron studies and b12 levels.  I would recommend getting those too so if she is anemic, we don't have to re-stick her.   [place]

## 2014-06-19 NOTE — Addendum Note (Signed)
Addended by: Cherylynn Ridges on: 06/19/2014 09:18 AM   Modules accepted: Orders

## 2014-06-19 NOTE — Telephone Encounter (Signed)
No return call from patient who does need regular f/u appointment.  P.O.F. Generated for routine f/u with labs as indicated in this documenatation.

## 2014-06-25 ENCOUNTER — Telehealth: Payer: Self-pay | Admitting: *Deleted

## 2014-06-25 ENCOUNTER — Telehealth: Payer: Self-pay | Admitting: Adult Health

## 2014-06-25 NOTE — Telephone Encounter (Signed)
S/w pt today regarding a pof from 06/19/14.  Jenny Griffin had s/w this pt and the pt wanted to not come in now and wait until she was due for a b12 inj.  there was some uneayness with her doing this and she asked that i all the pt.  I looked at  her chart notes and i myself had concerns with making an appt out so far.  Mendel Ryder states that the pt gives herself her own shots and that if she is due not until 8/21 that it is ok to wait on the appt.  . I called the pt and again she states that she has had these crash feelings before but needs to come in on a late Friday due to her driving from [place].  There are no late times avail.  I do not feel cofortable leaving with this without another nurse call or speaking with Mendel Ryder.  I will s/w Mendel Ryder and have her direct.

## 2014-06-25 NOTE — Telephone Encounter (Signed)
Called to f/u with pt about  having SOB on 06/17/14 and to make an appt for her to be seen here in office. No answer but left a detailed message on VM concerning this matter and l asked her to call this nurse back @ [phone]. I will try to contact pt again. Message to be forwarded to Charlestine Massed, NP.

## 2014-06-25 NOTE — Telephone Encounter (Signed)
Second attempt to call pt . No answer, left a message to pt's VM concerning making an appt for her to be seen and to see if she was still having symptoms of shortness of breath. I advised pt via voicemail if she was still experiencing these symptoms to go to nearest ED to be evaluated. Message to be forwarded to Charlestine Massed, NP.

## 2014-07-02 ENCOUNTER — Ambulatory Visit: Payer: Self-pay | Admitting: Adult Health

## 2014-07-02 ENCOUNTER — Other Ambulatory Visit: Payer: Self-pay

## 2014-07-30 ENCOUNTER — Telehealth: Payer: Self-pay | Admitting: Adult Health

## 2014-07-30 ENCOUNTER — Telehealth: Payer: Self-pay | Admitting: Nurse Practitioner

## 2014-07-30 NOTE — Telephone Encounter (Signed)
, °

## 2014-07-31 ENCOUNTER — Telehealth: Payer: Self-pay

## 2014-07-31 DIAGNOSIS — D508 Other iron deficiency anemias: Secondary | ICD-10-CM

## 2014-07-31 MED ORDER — CYANOCOBALAMIN 1000 MCG/ML IJ SOLN: 1000.0000 ug | INTRAMUSCULAR | Status: DC

## 2014-07-31 NOTE — Telephone Encounter (Signed)
B12 shots refill - needed

## 2014-08-02 ENCOUNTER — Ambulatory Visit: Payer: Self-pay | Admitting: Nurse Practitioner

## 2014-08-02 ENCOUNTER — Other Ambulatory Visit: Payer: Self-pay

## 2014-08-09 ENCOUNTER — Telehealth: Payer: Self-pay | Admitting: Adult Health

## 2014-08-09 ENCOUNTER — Ambulatory Visit: Payer: Self-pay | Admitting: Adult Health

## 2014-08-09 ENCOUNTER — Other Ambulatory Visit: Payer: Self-pay

## 2014-08-09 NOTE — Telephone Encounter (Signed)
, °

## 2014-08-23 ENCOUNTER — Ambulatory Visit: Payer: Self-pay | Admitting: Adult Health

## 2014-08-23 ENCOUNTER — Other Ambulatory Visit: Payer: Self-pay

## 2014-08-23 ENCOUNTER — Telehealth: Payer: Self-pay | Admitting: Adult Health

## 2014-08-23 NOTE — Telephone Encounter (Signed)
, °

## 2014-09-10 ENCOUNTER — Telehealth: Payer: Self-pay | Admitting: Adult Health

## 2014-09-10 ENCOUNTER — Ambulatory Visit: Payer: Self-pay | Admitting: Adult Health

## 2014-09-10 ENCOUNTER — Other Ambulatory Visit: Payer: Self-pay

## 2014-09-24 ENCOUNTER — Ambulatory Visit (HOSPITAL_BASED_OUTPATIENT_CLINIC_OR_DEPARTMENT_OTHER): Payer: Self-pay | Admitting: Adult Health

## 2014-09-24 ENCOUNTER — Other Ambulatory Visit (HOSPITAL_BASED_OUTPATIENT_CLINIC_OR_DEPARTMENT_OTHER): Payer: Self-pay

## 2014-09-24 ENCOUNTER — Encounter: Payer: Self-pay | Admitting: Adult Health

## 2014-09-24 VITALS — BP 110/60 | HR 69 | Temp 97.9°F | Resp 20 | Ht 59.0 in | Wt 161.2 lb

## 2014-09-24 DIAGNOSIS — D519 Vitamin B12 deficiency anemia, unspecified: Secondary | ICD-10-CM

## 2014-09-24 DIAGNOSIS — D6489 Other specified anemias: Secondary | ICD-10-CM

## 2014-09-24 DIAGNOSIS — D509 Iron deficiency anemia, unspecified: Secondary | ICD-10-CM

## 2014-09-24 DIAGNOSIS — D649 Anemia, unspecified: Secondary | ICD-10-CM

## 2014-09-24 LAB — CBC WITH DIFFERENTIAL/PLATELET
BASO%: 0.7 % (ref 0.0–2.0)
BASOS ABS: 0.1 10*3/uL (ref 0.0–0.1)
EOS%: 3.5 % (ref 0.0–7.0)
Eosinophils Absolute: 0.3 10*3/uL (ref 0.0–0.5)
HEMATOCRIT: 43.2 % (ref 34.8–46.6)
HGB: 14.2 g/dL (ref 11.6–15.9)
LYMPH%: 22.1 % (ref 14.0–49.7)
MCH: 28.9 pg (ref 25.1–34.0)
MCHC: 32.8 g/dL (ref 31.5–36.0)
MCV: 88.1 fL (ref 79.5–101.0)
MONO#: 0.5 10*3/uL (ref 0.1–0.9)
MONO%: 6.1 % (ref 0.0–14.0)
NEUT%: 67.6 % (ref 38.4–76.8)
NEUTROS ABS: 5.6 10*3/uL (ref 1.5–6.5)
PLATELETS: 254 10*3/uL (ref 145–400)
RBC: 4.91 10*6/uL (ref 3.70–5.45)
RDW: 12.3 % (ref 11.2–14.5)
WBC: 8.2 10*3/uL (ref 3.9–10.3)
lymph#: 1.8 10*3/uL (ref 0.9–3.3)

## 2014-09-24 LAB — IRON AND TIBC CHCC
%SAT: 27 % (ref 21–57)
Iron: 72 ug/dL (ref 41–142)
TIBC: 263 ug/dL (ref 236–444)
UIBC: 191 ug/dL (ref 120–384)

## 2014-09-24 LAB — COMPREHENSIVE METABOLIC PANEL (CC13)
ALT: 16 U/L (ref 0–55)
ANION GAP: 9 meq/L (ref 3–11)
AST: 10 U/L (ref 5–34)
Albumin: 3.8 g/dL (ref 3.5–5.0)
Alkaline Phosphatase: 89 U/L (ref 40–150)
BUN: 6.1 mg/dL — ABNORMAL LOW (ref 7.0–26.0)
CO2: 23 meq/L (ref 22–29)
CREATININE: 0.7 mg/dL (ref 0.6–1.1)
Calcium: 8.9 mg/dL (ref 8.4–10.4)
Chloride: 108 mEq/L (ref 98–109)
Glucose: 127 mg/dl (ref 70–140)
Potassium: 3.7 mEq/L (ref 3.5–5.1)
SODIUM: 139 meq/L (ref 136–145)
TOTAL PROTEIN: 7.9 g/dL (ref 6.4–8.3)
Total Bilirubin: 0.23 mg/dL (ref 0.20–1.20)

## 2014-09-24 LAB — FERRITIN CHCC: Ferritin: 107 ng/ml (ref 9–269)

## 2014-09-24 LAB — VITAMIN B12: VITAMIN B 12: 1008 pg/mL — AB (ref 211–911)

## 2014-09-24 NOTE — Progress Notes (Addendum)
OFFICE PROGRESS NOTE  CC Dr. Neoma Laming Dr. Vella Kohler  DIAGNOSIS: 43 year old female with multifactorial anemia patient has received B12 and iron infusions in the past.  PRIOR THERAPY:  #1 patient has been receiving B12 injections every 3 weeks.  #2 S/p IV iron in February 2013  CURRENT THERAPY:Patient will receive B12 injections every 3 weeks  INTERVAL HISTORY:  MIRINDA MONTE 43 y.o. female returns for follow up today.  She receives B12 every 3 weeks and does self inject.  She does exercise and is doing moderately well with this.  She does complain of gasping for air more frequently with activity and is concerned because the last time it did this she was diagnosed with anemia.  She does have peripheral neuropathy that is stable.  She also is having more panic attacks she attributes to the B12 injections. She did note later on in the appointment that maybe she is going through more stress than usual as well.  She denies any other questions or concerns.     MEDICAL HISTORY: Past Medical History  Diagnosis Date  . Anemia   . Anxiety   . Panic attacks january 2013    ALLERGIES:  has No Known Allergies.  MEDICATIONS:  Current Outpatient Prescriptions  Medication Sig Dispense Refill  . cyanocobalamin (,VITAMIN B-12,) 1000 MCG/ML injection Inject 1 mL (1,000 mcg total) into the muscle every 21 ( twenty-one) days. 5 mL 4  . ondansetron (ZOFRAN-ODT) 4 MG disintegrating tablet     . ondansetron (ZOFRAN) 4 MG tablet TAKE 1 TABLET BY MOUTH EVERY 4 HOURS AS NEEDED FOR NAUSEA AND VOMITING 30 tablet 3  . venlafaxine XR (EFFEXOR-XR) 37.5 MG 24 hr capsule Take 1 capsule (37.5 mg total) by mouth daily. 30 capsule 6   No current facility-administered medications for this visit.    SURGICAL HISTORY:  Past Surgical History  Procedure Laterality Date  . Cesarean section    . Right oophorectomy      REVIEW OF SYSTEMS:   A 10 point review of systems was conducted and is otherwise  negative except for what is noted above.    PHYSICAL EXAM BP 110/60 mmHg  Pulse 69  Temp(Src) 97.9 F (36.6 C) (Oral)  Resp 20  Ht 4\' 11"  (1.499 m)  Wt 161 lb 3.2 oz (73.12 kg)  BMI 32.54 kg/m2 GENERAL: Patient is a well appearing female in no acute distress HEENT:  Sclerae anicteric.  Oropharynx clear and moist. No ulcerations or evidence of oropharyngeal candidiasis. Neck is supple.  NODES:  No cervical, supraclavicular, or axillary lymphadenopathy palpated.  LUNGS:  Clear to auscultation bilaterally.  No wheezes or rhonchi. HEART:  Regular rate and rhythm. No murmur appreciated. ABDOMEN:  Soft, nontender.  Positive, normoactive bowel sounds. No organomegaly palpated. MSK:  No focal spinal tenderness to palpation. Full range of motion bilaterally in the upper extremities. EXTREMITIES:  No peripheral edema.   SKIN:  Clear with no obvious rashes or skin changes. No nail dyscrasia. NEURO:  Nonfocal. Well oriented.  Appropriate affect. ECOG PERFORMANCE STATUS: 1 - Symptomatic but completely ambulatory  LABORATORY DATA: Lab Results  Component Value Date   WBC 8.2 09/24/2014   HGB 14.2 09/24/2014   HCT 43.2 09/24/2014   MCV 88.1 09/24/2014   PLT 254 09/24/2014      Chemistry      Component Value Date/Time   NA 141 03/29/2014 1455   NA 140 08/24/2011 0908   K 3.6 03/29/2014 1455   K 3.9  08/24/2011 0908   CL 106 12/15/2012 0856   CL 108 08/24/2011 0908   CO2 23 03/29/2014 1455   CO2 21 08/24/2011 0908   BUN 9.9 03/29/2014 1455   BUN 9 08/24/2011 0908   CREATININE 0.7 03/29/2014 1455   CREATININE 0.60 08/24/2011 0908      Component Value Date/Time   CALCIUM 9.3 03/29/2014 1455   CALCIUM 8.9 08/24/2011 0908   ALKPHOS 85 03/29/2014 1455   ALKPHOS 70 08/24/2011 0908   AST 11 03/29/2014 1455   AST 10 08/24/2011 0908   ALT 15 03/29/2014 1455   ALT 11 08/24/2011 0908   BILITOT 0.28 03/29/2014 1455   BILITOT 0.3 08/24/2011 0908       RADIOGRAPHIC STUDIES:  No  results found.  ASSESSMENT: 43 year old female with:  1.  multifactorial anemia including B12 and iron deficiency. She has been getting replacements for both of them. She is getting B12  Injections every 3 weeks.  2. Stus post IV iron  3. Panic attacks  4. Fatigue/chronic fatigue  PLAN:  1. Patient is doing relatively well today.  Her CBC is normal.  I reviewed this with her in detail.   Her B12 and iron studies are pending. We will call her with the results.  She did meet Dr. Lindi Adie today.    2. Continue B12 injections q 3 weeks.  She will have a friend inject or self inject these.  Garnett Farm verbalized understanding of this process.    Jenah will return in 6 months for labs and f/u.    All questions were answered. The patient knows to call the clinic with any problems, questions or concerns. We can certainly see the patient much sooner if necessary.  I spent 25 minutes counseling the patient face to face. The total time spent in the appointment was 30 minutes.  Minette Headland, NP Medical Oncology [facility] [phone]  Attending Note  I personally saw and examined MCKENA CHERN. The plan of care was discussed with her. I agree with the assessment and plan as documented above. Pernicious anemia: Continue with daily B12 injections Iron deficiency anemia: Patient gets from occasional IV iron infusions. Previously Dr. Humphrey Rolls had ferritin less than 100 this cutout to give IV iron. I do not think that is necessary and we will keep a close watch on her ferritin levels and her symptoms. We might give intravenous iron and ferritin levels drop below 20. August the iron saturation drops below 20%. Signed Rulon Eisenmenger, MD

## 2014-09-25 ENCOUNTER — Telehealth: Payer: Self-pay | Admitting: Hematology and Oncology

## 2014-09-25 NOTE — Telephone Encounter (Signed)
, °

## 2014-10-30 ENCOUNTER — Other Ambulatory Visit: Payer: Self-pay | Admitting: *Deleted

## 2014-10-30 DIAGNOSIS — D508 Other iron deficiency anemias: Secondary | ICD-10-CM

## 2014-10-30 MED ORDER — CYANOCOBALAMIN 1000 MCG/ML IJ SOLN: 1000.0000 ug | INTRAMUSCULAR | Status: DC

## 2014-10-30 NOTE — Telephone Encounter (Signed)
Patient called requesting Vit B12 refill.  Refill will be sent to [facility] on [address]. after confirmation of pharmacy.  Instructed to call Pharmacy with refill request.

## 2014-11-09 ENCOUNTER — Other Ambulatory Visit: Payer: Self-pay | Admitting: Nurse Practitioner

## 2015-02-06 ENCOUNTER — Telehealth: Payer: Self-pay | Admitting: *Deleted

## 2015-02-06 NOTE — Telephone Encounter (Signed)
Patient called upset that she is out of Vitamin B-12 for her injections and her appt. With Dr. Lindi Adie is not until May.  Let her know that on 10-30-14 we did a 3 month supply with 4 refills - so in essence a year's supply.  She is also concerned about how often she will see Dr.Gudena and have her iron checked.  Let her know that when she sees him in May, she can discuss the frequency of visits.  Let her know that he is very easy to talk with and I think she will be very satisfied with the great care that he gives.  She appreciated my looking into this and is relieved that she has enough medicine for her injections.

## 2015-04-02 ENCOUNTER — Other Ambulatory Visit: Payer: Self-pay | Admitting: *Deleted

## 2015-04-02 DIAGNOSIS — D6489 Other specified anemias: Secondary | ICD-10-CM

## 2015-04-03 ENCOUNTER — Telehealth: Payer: Self-pay | Admitting: Hematology and Oncology

## 2015-04-03 ENCOUNTER — Other Ambulatory Visit: Payer: Self-pay

## 2015-04-03 ENCOUNTER — Ambulatory Visit: Payer: Self-pay | Admitting: Hematology and Oncology

## 2015-04-03 NOTE — Assessment & Plan Note (Signed)
Pernicious anemia: Continue with B-12 injections, patient self injects Iron deficiency anemia: Also suspected to be due to malabsorption, occasionally gets IV iron infusions (01/20/12; 08/18/12)  Return to clinic in 6 months labs and follow-up

## 2015-04-03 NOTE — Telephone Encounter (Signed)
Patient called in to reschedule her appointment as she has a upper resp inf.  And has rescheduled to 5/16

## 2015-04-07 ENCOUNTER — Ambulatory Visit: Payer: Self-pay | Admitting: Hematology and Oncology

## 2015-04-07 ENCOUNTER — Other Ambulatory Visit: Payer: Self-pay

## 2015-04-07 NOTE — Assessment & Plan Note (Signed)
Pernicious anemia: Continue with B-12 injections, patient self injects Iron deficiency anemia: Also suspected to be due to malabsorption, occasionally gets IV iron infusions (01/20/12; 08/18/12)  Return to clinic in 6 months labs and follow-up

## 2015-04-08 ENCOUNTER — Other Ambulatory Visit: Payer: Self-pay

## 2015-05-02 ENCOUNTER — Other Ambulatory Visit: Payer: Self-pay

## 2015-11-07 ENCOUNTER — Other Ambulatory Visit: Payer: Self-pay | Admitting: Hematology and Oncology

## 2015-11-10 ENCOUNTER — Telehealth: Payer: Self-pay | Admitting: *Deleted

## 2015-11-10 NOTE — Telephone Encounter (Signed)
Received telephone advice record from [facility], sent to scan. Patient requesting refill of B12. Called [facility] in [place] and medication was refilled and picked up by patient.

## 2015-11-20 ENCOUNTER — Telehealth: Payer: Self-pay | Admitting: *Deleted

## 2015-11-20 ENCOUNTER — Other Ambulatory Visit: Payer: Self-pay | Admitting: *Deleted

## 2015-11-20 ENCOUNTER — Telehealth: Payer: Self-pay | Admitting: Hematology and Oncology

## 2015-11-20 DIAGNOSIS — D508 Other iron deficiency anemias: Secondary | ICD-10-CM

## 2015-11-20 MED ORDER — CYANOCOBALAMIN 1000 MCG/ML IJ SOLN: 1000.0000 ug | INTRAMUSCULAR | Status: DC

## 2015-11-20 NOTE — Telephone Encounter (Signed)
VM message from pt requesting refill on her B12 injections. She is out of them at this time.

## 2015-11-20 NOTE — Telephone Encounter (Signed)
Refill sent.

## 2015-11-20 NOTE — Telephone Encounter (Signed)
Returned a call to [phone] for a rescheduled lab and md visit

## 2015-12-02 ENCOUNTER — Telehealth: Payer: Self-pay | Admitting: Hematology and Oncology

## 2015-12-02 ENCOUNTER — Ambulatory Visit: Payer: Self-pay | Admitting: Hematology and Oncology

## 2015-12-02 ENCOUNTER — Other Ambulatory Visit: Payer: Self-pay

## 2015-12-02 NOTE — Assessment & Plan Note (Signed)
Multifactorial anemia including B12 and iron deficiency. She has been getting replacements for both of them. She is getting B12 Injections every 3 weeks ( home self injection)  Blood work review:   I recommended continuation of B-12 injections at home with blood work once a year  Return to clinic in 1 year for follow-up

## 2015-12-02 NOTE — Telephone Encounter (Signed)
Returned patients call to reschedule her  appointment °

## 2015-12-08 ENCOUNTER — Encounter: Payer: Self-pay | Admitting: Hematology and Oncology

## 2015-12-08 ENCOUNTER — Telehealth: Payer: Self-pay | Admitting: Hematology and Oncology

## 2015-12-08 ENCOUNTER — Ambulatory Visit (HOSPITAL_BASED_OUTPATIENT_CLINIC_OR_DEPARTMENT_OTHER): Payer: Self-pay | Admitting: Hematology and Oncology

## 2015-12-08 VITALS — BP 111/14 | HR 73 | Temp 98.2°F | Resp 18 | Wt 167.3 lb

## 2015-12-08 DIAGNOSIS — D510 Vitamin B12 deficiency anemia due to intrinsic factor deficiency: Secondary | ICD-10-CM

## 2015-12-08 DIAGNOSIS — D509 Iron deficiency anemia, unspecified: Secondary | ICD-10-CM

## 2015-12-08 NOTE — Assessment & Plan Note (Signed)
Multifactorial anemia including B12 and iron deficiency. She has been getting replacements for both of them. She is getting B12 Injections every 3 weeks Last IV iron infusion: 08/18/2012 Current treatment: Monthly B-12 self injections  Labs reviewed: Return to clinic once a year for follow-up

## 2015-12-08 NOTE — Progress Notes (Signed)
Patient Care Team: Consuela Mimes, MD as PCP - General (Internal Medicine)  DIAGNOSIS: Pernicious anemia with every 3 week self B-12 injections  CHIEF COMPLIANT: Annual follow-up of her anemia  INTERVAL HISTORY: Jenny Griffin is a 45 year old with above-mentioned history pernicious anemia on every 3 week B-12 injections. It appears that every 3 weeks she needs a B-12 injection otherwise she feels fatigued and short of breath and she can tell the difference. She has been exercising vigorously on a bicycle outdoors and trying to lose weight. Towards end of the 3 week period, she gets short of breath to minimal exertion like climbing stairs.  REVIEW OF SYSTEMS:   Constitutional: Denies fevers, chills or abnormal weight loss Eyes: Denies blurriness of vision Ears, nose, mouth, throat, and face: Denies mucositis or sore throat Respiratory: Denies cough, dyspnea or wheezes Cardiovascular: Denies palpitation, chest discomfort Gastrointestinal:  Denies nausea, heartburn or change in bowel habits Skin: Denies abnormal skin rashes Lymphatics: Denies new lymphadenopathy or easy bruising Neurological:Denies numbness, tingling or new weaknesses Behavioral/Psych: Mood is stable, no new changes  Extremities: No lower extremity edema  All other systems were reviewed with the patient and are negative.  I have reviewed the past medical history, past surgical history, social history and family history with the patient and they are unchanged from previous note.  ALLERGIES:  has No Known Allergies.  MEDICATIONS:  Current Outpatient Prescriptions  Medication Sig Dispense Refill  . cyanocobalamin (,VITAMIN B-12,) 1000 MCG/ML injection Inject 1 mL (1,000 mcg total) into the muscle every 21 ( twenty-one) days. 5 mL 5  . ondansetron (ZOFRAN) 4 MG tablet TAKE 1 TABLET BY MOUTH EVERY 4 HOURS AS NEEDED FOR NAUSEA AND VOMITING 30 tablet 3  . ondansetron (ZOFRAN-ODT) 4 MG disintegrating tablet     .  venlafaxine XR (EFFEXOR-XR) 37.5 MG 24 hr capsule Take 1 capsule (37.5 mg total) by mouth daily. 30 capsule 6   No current facility-administered medications for this visit.    PHYSICAL EXAMINATION: ECOG PERFORMANCE STATUS: 1 - Symptomatic but completely ambulatory  Filed Vitals:   12/08/15 1049  BP: 111/14  Pulse: 73  Temp: 98.2 F (36.8 C)  Resp: 18   Filed Weights   12/08/15 1049  Weight: 167 lb 4.8 oz (75.887 kg)    GENERAL:alert, no distress and comfortable SKIN: skin color, texture, turgor are normal, no rashes or significant lesions EYES: normal, Conjunctiva are pink and non-injected, sclera clear OROPHARYNX:no exudate, no erythema and lips, buccal mucosa, and tongue normal  NECK: supple, thyroid normal size, non-tender, without nodularity LYMPH:  no palpable lymphadenopathy in the cervical, axillary or inguinal LUNGS: clear to auscultation and percussion with normal breathing effort HEART: regular rate & rhythm and no murmurs and no lower extremity edema ABDOMEN:abdomen soft, non-tender and normal bowel sounds MUSCULOSKELETAL:no cyanosis of digits and no clubbing  NEURO: alert & oriented x 3 with fluent speech, no focal motor/sensory deficits EXTREMITIES: No lower extremity edema   LABORATORY DATA:  I have reviewed the data as listed   Chemistry      Component Value Date/Time   NA 139 09/24/2014 1329   NA 145 08/20/2012 0618   NA 140 08/24/2011 0908   K 3.7 09/24/2014 1329   K 3.4* 08/20/2012 0618   K 3.9 08/24/2011 0908   CL 106 12/15/2012 0856   CL 109* 08/20/2012 0618   CL 108 08/24/2011 0908   CO2 23 09/24/2014 1329   CO2 24 08/20/2012 0618   CO2 21  08/24/2011 0908   BUN 6.1* 09/24/2014 1329   BUN 8 08/20/2012 0618   BUN 9 08/24/2011 0908   CREATININE 0.7 09/24/2014 1329   CREATININE 0.68 08/20/2012 0618   CREATININE 0.60 08/24/2011 0908      Component Value Date/Time   CALCIUM 8.9 09/24/2014 1329   CALCIUM 8.3* 08/20/2012 0618   CALCIUM 8.9  08/24/2011 0908   ALKPHOS 89 09/24/2014 1329   ALKPHOS 79 08/20/2012 0618   ALKPHOS 70 08/24/2011 0908   AST 10 09/24/2014 1329   AST 8* 08/20/2012 0618   AST 10 08/24/2011 0908   ALT 16 09/24/2014 1329   ALT 17 08/20/2012 0618   ALT 11 08/24/2011 0908   BILITOT 0.23 09/24/2014 1329   BILITOT 0.3 08/20/2012 0618   BILITOT 0.3 08/24/2011 0908       Lab Results  Component Value Date   WBC 8.2 09/24/2014   HGB 14.2 09/24/2014   HCT 43.2 09/24/2014   MCV 88.1 09/24/2014   PLT 254 09/24/2014   NEUTROABS 5.6 09/24/2014     ASSESSMENT & PLAN:  Pernicious anemia Multifactorial anemia including B12 and iron deficiency. She has been getting replacements for both of them. She is getting B12 Injections every 3 weeks Last IV iron infusion: 08/18/2012 Current treatment: Monthly B-12 self injections  Labs reviewed: Hemoglobin is 14.2, ferritin 107, iron studies B-12 CBC and CMP are within normal limits. Because of patient has not see a family physician, next visit I will check a TSH level as well as fasting lipid panel. Patient exercises vigorously and is trying to lose weight.  Return to clinic once a year for follow-up  No orders of the defined types were placed in this encounter.   The patient has a good understanding of the overall plan. she agrees with it. she will call with any problems that may develop before the next visit here.   Rulon Eisenmenger, MD 12/08/2015

## 2015-12-08 NOTE — Telephone Encounter (Signed)
Appointments made and avs printed for patient °

## 2016-12-06 ENCOUNTER — Other Ambulatory Visit: Payer: Self-pay

## 2016-12-12 NOTE — Assessment & Plan Note (Deleted)
Multifactorial anemia including B12 and iron deficiency. She has been getting replacements for both of them. She is getting B12 Injections every 3 weeks Last IV iron infusion: 08/18/2012 Current treatment: Monthly B-12 self injections  Labs reviewed: Hemoglobin is 14.2, ferritin 107, iron studies B-12 CBC and CMP are within normal limits. Because of patient has not see a family physician, next visit I will check a TSH level as well as fasting lipid panel. Patient exercises vigorously and is trying to lose weight.  Return to clinic once a year for follow-up

## 2016-12-13 ENCOUNTER — Ambulatory Visit: Payer: Self-pay | Admitting: Hematology and Oncology

## 2016-12-13 NOTE — Progress Notes (Deleted)
Patient Care Team: Marcy Panning, MD as PCP - General (Internal Medicine)  DIAGNOSIS:  Encounter Diagnosis  Name Primary?  . Pernicious anemia    CHIEF COMPLIANT: Follow-up of pernicious anemia on B-12 injections  INTERVAL HISTORY: Jenny Griffin is a 46 year old with pernicious anemia who receives B-12 injections every 3 weeks. If she does not receive the injection she feels fatigued and short of breath. She continues to stay active as much as possible is continuing to try to lose weight. She is tolerating the B-12 injections extremely well. She couldn't tell when she is running low on B-12 which happens to be by the end of the third week.  REVIEW OF SYSTEMS:   Constitutional: Denies fevers, chills or abnormal weight loss Eyes: Denies blurriness of vision Ears, nose, mouth, throat, and face: Denies mucositis or sore throat Respiratory: Denies cough, dyspnea or wheezes Cardiovascular: Denies palpitation, chest discomfort Gastrointestinal:  Denies nausea, heartburn or change in bowel habits Skin: Denies abnormal skin rashes Lymphatics: Denies new lymphadenopathy or easy bruising Neurological:Denies numbness, tingling or new weaknesses Behavioral/Psych: Mood is stable, no new changes  Extremities: No lower extremity edema  All other systems were reviewed with the patient and are negative.  I have reviewed the past medical history, past surgical history, social history and family history with the patient and they are unchanged from previous note.  ALLERGIES:  has No Known Allergies.  MEDICATIONS:  Current Outpatient Prescriptions  Medication Sig Dispense Refill  . cyanocobalamin (,VITAMIN B-12,) 1000 MCG/ML injection Inject 1 mL (1,000 mcg total) into the muscle every 21 ( twenty-one) days. 5 mL 5  . ondansetron (ZOFRAN) 4 MG tablet TAKE 1 TABLET BY MOUTH EVERY 4 HOURS AS NEEDED FOR NAUSEA AND VOMITING 30 tablet 3  . ondansetron (ZOFRAN-ODT) 4 MG disintegrating tablet     .  venlafaxine XR (EFFEXOR-XR) 37.5 MG 24 hr capsule Take 1 capsule (37.5 mg total) by mouth daily. 30 capsule 6   No current facility-administered medications for this visit.     PHYSICAL EXAMINATION: ECOG PERFORMANCE STATUS: 1 - Symptomatic but completely ambulatory  There were no vitals filed for this visit. There were no vitals filed for this visit.  GENERAL:alert, no distress and comfortable SKIN: skin color, texture, turgor are normal, no rashes or significant lesions EYES: normal, Conjunctiva are pink and non-injected, sclera clear OROPHARYNX:no exudate, no erythema and lips, buccal mucosa, and tongue normal  NECK: supple, thyroid normal size, non-tender, without nodularity LYMPH:  no palpable lymphadenopathy in the cervical, axillary or inguinal LUNGS: clear to auscultation and percussion with normal breathing effort HEART: regular rate & rhythm and no murmurs and no lower extremity edema ABDOMEN:abdomen soft, non-tender and normal bowel sounds MUSCULOSKELETAL:no cyanosis of digits and no clubbing  NEURO: alert & oriented x 3 with fluent speech, no focal motor/sensory deficits EXTREMITIES: No lower extremity edema  LABORATORY DATA:  I have reviewed the data as listed   Chemistry      Component Value Date/Time   NA 139 09/24/2014 1329   K 3.7 09/24/2014 1329   CL 106 12/15/2012 0856   CO2 23 09/24/2014 1329   BUN 6.1 (L) 09/24/2014 1329   CREATININE 0.7 09/24/2014 1329      Component Value Date/Time   CALCIUM 8.9 09/24/2014 1329   ALKPHOS 89 09/24/2014 1329   AST 10 09/24/2014 1329   ALT 16 09/24/2014 1329   BILITOT 0.23 09/24/2014 1329       Lab Results  Component Value Date  WBC 8.2 09/24/2014   HGB 14.2 09/24/2014   HCT 43.2 09/24/2014   MCV 88.1 09/24/2014   PLT 254 09/24/2014   NEUTROABS 5.6 09/24/2014    ASSESSMENT & PLAN:  Pernicious anemia Multifactorial anemia including B12 and iron deficiency. She has been getting replacements for both of them.  She is getting B12 Injections every 3 weeks Last IV iron infusion: 08/18/2012 Current treatment: Monthly B-12 self injections  Labs reviewed: Hemoglobin is 14.2, ferritin 107, iron studies B-12 CBC and CMP are within normal limits. Because of patient has not see a family physician, next visit I will check a TSH level as well as fasting lipid panel. Patient exercises vigorously and is trying to lose weight.  Return to clinic once a year for follow-up   I spent 25 minutes talking to the patient of which more than half was spent in counseling and coordination of care.  No orders of the defined types were placed in this encounter.  The patient has a good understanding of the overall plan. she agrees with it. she will call with any problems that may develop before the next visit here.   Rulon Eisenmenger, MD 12/13/16

## 2017-11-11 ENCOUNTER — Encounter: Payer: Self-pay | Admitting: Emergency Medicine

## 2017-11-11 ENCOUNTER — Observation Stay
Admission: EM | Admit: 2017-11-11 | Discharge: 2017-11-12 | Disposition: A | Discharge status: Home / Self Care | Payer: BLUE CROSS/BLUE SHIELD | Attending: Internal Medicine | Admitting: Internal Medicine

## 2017-11-11 ENCOUNTER — Emergency Department: Payer: BLUE CROSS/BLUE SHIELD

## 2017-11-11 ENCOUNTER — Other Ambulatory Visit: Payer: Self-pay

## 2017-11-11 DIAGNOSIS — D510 Vitamin B12 deficiency anemia due to intrinsic factor deficiency: Secondary | ICD-10-CM | POA: Insufficient documentation

## 2017-11-11 DIAGNOSIS — R42 Dizziness and giddiness: Secondary | ICD-10-CM | POA: Insufficient documentation

## 2017-11-11 DIAGNOSIS — I959 Hypotension, unspecified: Secondary | ICD-10-CM | POA: Insufficient documentation

## 2017-11-11 DIAGNOSIS — F410 Panic disorder [episodic paroxysmal anxiety] without agoraphobia: Secondary | ICD-10-CM | POA: Insufficient documentation

## 2017-11-11 DIAGNOSIS — R112 Nausea with vomiting, unspecified: Secondary | ICD-10-CM | POA: Diagnosis not present

## 2017-11-11 DIAGNOSIS — D219 Benign neoplasm of connective and other soft tissue, unspecified: Secondary | ICD-10-CM

## 2017-11-11 DIAGNOSIS — E876 Hypokalemia: Secondary | ICD-10-CM | POA: Diagnosis not present

## 2017-11-11 DIAGNOSIS — R0789 Other chest pain: Principal | ICD-10-CM | POA: Insufficient documentation

## 2017-11-11 DIAGNOSIS — R1084 Generalized abdominal pain: Secondary | ICD-10-CM | POA: Diagnosis present

## 2017-11-11 DIAGNOSIS — R079 Chest pain, unspecified: Secondary | ICD-10-CM

## 2017-11-11 DIAGNOSIS — R002 Palpitations: Secondary | ICD-10-CM | POA: Insufficient documentation

## 2017-11-11 DIAGNOSIS — R1013 Epigastric pain: Secondary | ICD-10-CM | POA: Diagnosis not present

## 2017-11-11 LAB — BASIC METABOLIC PANEL
Anion gap: 8 (ref 5–15)
BUN: 11 mg/dL (ref 6–20)
CHLORIDE: 106 mmol/L (ref 101–111)
CO2: 23 mmol/L (ref 22–32)
CREATININE: 0.66 mg/dL (ref 0.44–1.00)
Calcium: 9 mg/dL (ref 8.9–10.3)
GFR calc non Af Amer: >60 mL/min (ref >60)
Glucose, Bld: 113 mg/dL — ABNORMAL HIGH (ref 65–99)
Potassium: 3.4 mmol/L — ABNORMAL LOW (ref 3.5–5.1)
Sodium: 137 mmol/L (ref 135–145)

## 2017-11-11 LAB — CBC
HEMATOCRIT: 39 % (ref 35.0–47.0)
Hemoglobin: 13.3 g/dL (ref 12.0–16.0)
MCH: 28.6 pg (ref 26.0–34.0)
MCHC: 34.2 g/dL (ref 32.0–36.0)
MCV: 83.6 fL (ref 80.0–100.0)
Platelets: 277 10*3/uL (ref 150–440)
RBC: 4.66 MIL/uL (ref 3.80–5.20)
RDW: 13 % (ref 11.5–14.5)
WBC: 9.2 10*3/uL (ref 3.6–11.0)

## 2017-11-11 LAB — TROPONIN I: Troponin I: <0.03 ng/mL (ref <0.03)

## 2017-11-11 MED ORDER — IOPAMIDOL (ISOVUE-370) INJECTION 76%
75.0000 mL | Freq: Once | INTRAVENOUS | Status: AC | PRN
  Administered 2017-11-11: 75 mL via INTRAVENOUS

## 2017-11-11 MED ORDER — MORPHINE SULFATE (PF) 4 MG/ML IV SOLN
4.0000 mg | Freq: Once | INTRAVENOUS | Status: AC
Start: 2017-11-11 — End: 2017-11-11
  Administered 2017-11-11: 4 mg via INTRAVENOUS
  Filled 2017-11-11: qty 1

## 2017-11-11 MED ORDER — ONDANSETRON HCL 4 MG/2ML IJ SOLN
4.0000 mg | Freq: Once | INTRAMUSCULAR | Status: AC
  Administered 2017-11-11: 4 mg via INTRAVENOUS
  Filled 2017-11-11: qty 2

## 2017-11-11 NOTE — ED Triage Notes (Signed)
Pt reports epigastric pain for about a week saw her physician had monitor for 24hrs and turned it in today, pt reports palpitations had increased today, dizziness,reports when she takes a deep breath chest pain increases, pt talks in complete sentences no respiratory distress noted

## 2017-11-11 NOTE — ED Notes (Signed)
MD at bedside. Pt and pts family in room.

## 2017-11-11 NOTE — ED Provider Notes (Signed)
[facility] Emergency Department Provider Note  Time seen: 10:43 PM  I have reviewed the triage vital signs and the nursing notes.   HISTORY  Chief Complaint Chest Pain and Palpitations    HPI Jenny Griffin is a 46 y.o. female with a past medical history of anxiety, anemia, presents to the emergency department for chest pain.  According to the patient for the past 1 week she has been experiencing chest pain which she describes as constant but worse with deep inspiration.  She also states she is having palpitations intermittently throughout the day.  States the pain is coming during exertion as well as at rest.  Denies history of chest pain in the past.  Denies any current nausea or shortness of breath.  States she does have increased pain and will often cough if she takes a deep inspiration.  Patient is on estrogen supplementation, no history of DVT.  Patient was seen by primary care doctor 2 days ago to establish care in the area, she had a panel of labs performed that were largely within normal limits and wore a Holter monitor for 48 hours which was turned in today but the patient does not yet know the results.  Patient states since turning in the Holter monitor she continued to have palpitations and was having chest pain worse with inspiration so she came to the emergency department for evaluation.   Past Medical History:  Diagnosis Date  . Anemia   . Anxiety   . Panic attacks january 2013    Patient Active Problem List   Diagnosis Date Noted  . Nausea alone 05/02/2012  . Pernicious anemia 09/16/2011    Past Surgical History:  Procedure Laterality Date  . CESAREAN SECTION    . RIGHT OOPHORECTOMY      Prior to Admission medications   Medication Sig Start Date End Date Taking? Authorizing Provider  cyanocobalamin (,VITAMIN B-12,) 1000 MCG/ML injection Inject 1 mL (1,000 mcg total) into the muscle every 21 ( twenty-one) days. 11/20/15   Nicholas Lose, MD   ondansetron (ZOFRAN) 4 MG tablet TAKE 1 TABLET BY MOUTH EVERY 4 HOURS AS NEEDED FOR NAUSEA AND VOMITING 06/05/12   Wyatt Portela, MD  ondansetron (ZOFRAN-ODT) 4 MG disintegrating tablet  01/17/13   [provider]  venlafaxine XR (EFFEXOR-XR) 37.5 MG 24 hr capsule Take 1 capsule (37.5 mg total) by mouth daily. 08/03/13   Marcy Panning, MD    No Known Allergies  No family history on file.  Social History Social History   Tobacco Use  . Smoking status: Never Smoker  . Smokeless tobacco: Never Used  Substance Use Topics  . Alcohol use: No  . Drug use: No    Review of Systems Constitutional: Negative for fever. Cardiovascular: Positive for central chest pain worse with deep inspiration occasionally radiating to her upper back. Respiratory: Negative for shortness of breath.  Occasional cough with deep inspiration Gastrointestinal: Negative for abdominal pain Musculoskeletal: Negative for leg pain or swelling All other ROS negative  ____________________________________________   PHYSICAL EXAM:  VITAL SIGNS: ED Triage Vitals  Enc Vitals Group     BP 11/11/17 2157 (!) 147/72     Pulse Rate 11/11/17 2157 87     Resp 11/11/17 2157 20     Temp 11/11/17 2157 98.4 F (36.9 C)     Temp Source 11/11/17 2157 Oral     SpO2 11/11/17 2157 97 %     Weight 11/11/17 2200 162 lb (  73.5 kg)     Height 11/11/17 2200 4\' 11"  (1.499 m)     Head Circumference --      Peak Flow --      Pain Score 11/11/17 2156 9     Pain Loc --      Pain Edu? --      Excl. in [place]? --    Constitutional: Alert and oriented. Well appearing and in no distress. Eyes: Normal exam ENT   Head: Normocephalic and atraumatic.   Mouth/Throat: Mucous membranes are moist. Cardiovascular: Normal rate, regular rhythm. No murmur Respiratory: Normal respiratory effort without tachypnea nor retractions. Breath sounds are clear  Gastrointestinal: Soft and nontender. No distention.   Musculoskeletal: Nontender  with normal range of motion in all extremities.  Neurologic:  Normal speech and language. No gross focal neurologic deficits Skin:  Skin is warm, dry and intact.  Psychiatric: Mood and affect are normal.  ____________________________________________    EKG  EKG reviewed and interpreted by myself shows normal sinus rhythm 82 bpm with a narrow QRS, normal axis, normal intervals, no concerning ST changes.  ____________________________________________    RADIOLOGY  Chest x-ray normal  ____________________________________________   INITIAL IMPRESSION / ASSESSMENT AND PLAN / ED COURSE  Pertinent labs & imaging results that were available during my care of the patient were reviewed by me and considered in my medical decision making (see chart for details).  Patient presents to the emergency department for chest pain for the past 7 days fairly constant per patient but worse with inspiration.  Differential would include chest wall pain, anxiety, pulmonary embolism, ACS, pneumonia, pneumothorax, gastric reflux.  Overall the patient appears well, no distress she is somewhat anxious during a.  EKG is normal, chest x-ray is normal, labs have resulted normal including a negative troponin.  However given the patient's history of estrogen supplementation as well as pleuritic chest pain worse with deep inspiration will obtain a CT angiography of the chest to further evaluate.  I discussed the pros and cons of radiation and CT imaging the patient is agreeable to proceed with CT.  ____________________________________________   FINAL CLINICAL IMPRESSION(S) / ED DIAGNOSES  Chest pain    Harvest Dark, MD 11/13/17 [phone]

## 2017-11-12 ENCOUNTER — Encounter: Payer: Self-pay | Admitting: Internal Medicine

## 2017-11-12 ENCOUNTER — Emergency Department: Payer: BLUE CROSS/BLUE SHIELD

## 2017-11-12 ENCOUNTER — Observation Stay
Admit: 2017-11-12 | Discharge: 2017-11-12 | Disposition: A | Discharge status: Home / Self Care | Payer: BLUE CROSS/BLUE SHIELD | Attending: Internal Medicine | Admitting: Internal Medicine

## 2017-11-12 DIAGNOSIS — R079 Chest pain, unspecified: Secondary | ICD-10-CM | POA: Diagnosis present

## 2017-11-12 LAB — URINALYSIS, COMPLETE (UACMP) WITH MICROSCOPIC
BILIRUBIN URINE: NEGATIVE
GLUCOSE, UA: NEGATIVE mg/dL
Hgb urine dipstick: NEGATIVE
Ketones, ur: NEGATIVE mg/dL
LEUKOCYTES UA: NEGATIVE
Nitrite: NEGATIVE
PROTEIN: NEGATIVE mg/dL
RBC / HPF: NONE SEEN RBC/hpf (ref 0–5)
Specific Gravity, Urine: 1.028 (ref 1.005–1.030)
pH: 7 (ref 5.0–8.0)

## 2017-11-12 LAB — LIPID PANEL
CHOLESTEROL: 156 mg/dL (ref 0–200)
HDL: 42 mg/dL (ref >40)
LDL CALC: 98 mg/dL (ref 0–99)
TRIGLYCERIDES: 81 mg/dL (ref <150)
Total CHOL/HDL Ratio: 3.7 RATIO
VLDL: 16 mg/dL (ref 0–40)

## 2017-11-12 LAB — BASIC METABOLIC PANEL
ANION GAP: 5 (ref 5–15)
BUN: 7 mg/dL (ref 6–20)
CALCIUM: 7.7 mg/dL — AB (ref 8.9–10.3)
CO2: 21 mmol/L — AB (ref 22–32)
Chloride: 113 mmol/L — ABNORMAL HIGH (ref 101–111)
Creatinine, Ser: 0.66 mg/dL (ref 0.44–1.00)
GFR calc non Af Amer: >60 mL/min (ref >60)
Glucose, Bld: 91 mg/dL (ref 65–99)
Potassium: 3.4 mmol/L — ABNORMAL LOW (ref 3.5–5.1)
SODIUM: 139 mmol/L (ref 135–145)

## 2017-11-12 LAB — CBC
HCT: 33.8 % — ABNORMAL LOW (ref 35.0–47.0)
HEMOGLOBIN: 11.3 g/dL — AB (ref 12.0–16.0)
MCH: 28.2 pg (ref 26.0–34.0)
MCHC: 33.4 g/dL (ref 32.0–36.0)
MCV: 84.6 fL (ref 80.0–100.0)
Platelets: 210 10*3/uL (ref 150–440)
RBC: 4 MIL/uL (ref 3.80–5.20)
RDW: 13.1 % (ref 11.5–14.5)
WBC: 6 10*3/uL (ref 3.6–11.0)

## 2017-11-12 LAB — TROPONIN I: Troponin I: <0.03 ng/mL (ref <0.03)

## 2017-11-12 LAB — POCT PREGNANCY, URINE: Preg Test, Ur: NEGATIVE

## 2017-11-12 LAB — HEPATIC FUNCTION PANEL
ALBUMIN: 3.9 g/dL (ref 3.5–5.0)
ALK PHOS: 61 U/L (ref 38–126)
ALT: 20 U/L (ref 14–54)
AST: 19 U/L (ref 15–41)
Bilirubin, Direct: <0.1 mg/dL — ABNORMAL LOW (ref 0.1–0.5)
TOTAL PROTEIN: 7.9 g/dL (ref 6.5–8.1)
Total Bilirubin: 0.3 mg/dL (ref 0.3–1.2)

## 2017-11-12 LAB — LIPASE, BLOOD: Lipase: 42 U/L (ref 11–51)

## 2017-11-12 MED ORDER — MIDAZOLAM HCL 5 MG/5ML IJ SOLN
INTRAMUSCULAR | Status: AC
  Filled 2017-11-12: qty 5

## 2017-11-12 MED ORDER — FENTANYL CITRATE (PF) 100 MCG/2ML IJ SOLN: 50.0000 ug | Freq: Once | INTRAMUSCULAR | Status: DC

## 2017-11-12 MED ORDER — ONDANSETRON HCL 4 MG/2ML IJ SOLN
4.0000 mg | Freq: Four times a day (QID) | INTRAMUSCULAR | Status: DC | PRN
  Administered 2017-11-12: 4 mg via INTRAVENOUS
  Filled 2017-11-12: qty 2

## 2017-11-12 MED ORDER — ASPIRIN EC 81 MG PO TBEC: 81.0000 mg | DELAYED_RELEASE_TABLET | Freq: Every day | ORAL | Status: DC

## 2017-11-12 MED ORDER — ONDANSETRON HCL 4 MG/2ML IJ SOLN
4.0000 mg | Freq: Once | INTRAMUSCULAR | Status: AC
  Administered 2017-11-12: 4 mg via INTRAVENOUS
  Filled 2017-11-12: qty 2

## 2017-11-12 MED ORDER — NITROGLYCERIN 0.4 MG SL SUBL: 0.4000 mg | SUBLINGUAL_TABLET | SUBLINGUAL | Status: DC | PRN

## 2017-11-12 MED ORDER — ASPIRIN 81 MG PO CHEW
324.0000 mg | CHEWABLE_TABLET | ORAL | Status: AC
  Administered 2017-11-12: 324 mg via ORAL
  Filled 2017-11-12: qty 4

## 2017-11-12 MED ORDER — SODIUM CHLORIDE 0.9 % IV SOLN
INTRAVENOUS | Status: DC
  Administered 2017-11-12 (×2): via INTRAVENOUS

## 2017-11-12 MED ORDER — PROMETHAZINE HCL 25 MG/ML IJ SOLN: 25.0000 mg | Freq: Four times a day (QID) | INTRAMUSCULAR | Status: DC | PRN

## 2017-11-12 MED ORDER — SODIUM CHLORIDE 0.9 % IV BOLUS (SEPSIS)
2000.0000 mL | Freq: Once | INTRAVENOUS | Status: AC
  Administered 2017-11-12: 2000 mL via INTRAVENOUS

## 2017-11-12 MED ORDER — ONDANSETRON HCL 4 MG/2ML IJ SOLN
4.0000 mg | Freq: Four times a day (QID) | INTRAMUSCULAR | Status: DC
  Administered 2017-11-12: 4 mg via INTRAVENOUS
  Filled 2017-11-12: qty 2

## 2017-11-12 MED ORDER — ASPIRIN 300 MG RE SUPP: 300.0000 mg | RECTAL | Status: AC

## 2017-11-12 MED ORDER — IOPAMIDOL (ISOVUE-300) INJECTION 61%
15.0000 mL | INTRAVENOUS | Status: AC
  Administered 2017-11-12: 15 mL via ORAL

## 2017-11-12 MED ORDER — PROMETHAZINE HCL 25 MG/ML IJ SOLN
12.5000 mg | Freq: Once | INTRAMUSCULAR | Status: AC
  Administered 2017-11-12: 12.5 mg via INTRAVENOUS
  Filled 2017-11-12: qty 1

## 2017-11-12 MED ORDER — ACETAMINOPHEN 325 MG PO TABS: 650.0000 mg | ORAL_TABLET | ORAL | Status: DC | PRN

## 2017-11-12 MED ORDER — SODIUM CHLORIDE 0.9 % IV BOLUS (SEPSIS)
1000.0000 mL | Freq: Once | INTRAVENOUS | Status: AC
  Administered 2017-11-12: 1000 mL via INTRAVENOUS

## 2017-11-12 MED ORDER — MIDAZOLAM HCL 5 MG/5ML IJ SOLN
1.0000 mg | Freq: Once | INTRAMUSCULAR | Status: AC
  Administered 2017-11-12: 1 mg via INTRAVENOUS

## 2017-11-12 MED ORDER — ENOXAPARIN SODIUM 40 MG/0.4ML ~~LOC~~ SOLN: 40.0000 mg | SUBCUTANEOUS | Status: DC

## 2017-11-12 MED ORDER — MORPHINE SULFATE (PF) 2 MG/ML IV SOLN
2.0000 mg | INTRAVENOUS | Status: DC | PRN
  Administered 2017-11-12: 2 mg via INTRAVENOUS
  Filled 2017-11-12: qty 1

## 2017-11-12 MED ORDER — KETOROLAC TROMETHAMINE 30 MG/ML IJ SOLN
10.0000 mg | Freq: Once | INTRAMUSCULAR | Status: AC
  Administered 2017-11-12: 9.9 mg via INTRAVENOUS
  Filled 2017-11-12: qty 1

## 2017-11-12 MED ORDER — ONDANSETRON HCL 4 MG/2ML IJ SOLN
INTRAMUSCULAR | Status: AC
  Administered 2017-11-12: 4 mg
  Filled 2017-11-12: qty 2

## 2017-11-12 NOTE — Progress Notes (Signed)
Pt has c/o of nausea she was given zofran, she stated that the zofraan is not touching her and she wants phenergan. New order for phenergan once per Dr. Estanislado Pandy.

## 2017-11-12 NOTE — Consult Note (Signed)
Reason for Consult: Chest pain palpitations anxiety Referring Physician: Dr. Kary Kos primary Dr. Tressia Miners hospitalist  Jenny Griffin is an 46 y.o. female.  HPI: Patient is a 46 year old white female history of pernicious anemia palpitations tachycardia present with worsening symptoms over the past week patient describes palpitations was seen by primary physician were Holter and returned it yesterday.  Patient states she had worsening symptoms while sitting at a bar last night so came to the emergency room was admitted.  Patient EKG was unremarkable she also complained of abdominal pain and had a CT of the chest to rule out PE and abdominal CT to rule out any significant abdominal pathology.  Patient now states that his symptoms are somewhat better she routinely exercises at the gym 3 or 4 times a week.  Patient gives a history of cardiac workup in 2011 including cardiac cath at [facility] which she states was unremarkable.  Patient has not seen cardiology in quite some time had recently moved back from [place] now has recurrent symptoms over the last week but is somewhat improved now.  Past Medical History:  Diagnosis Date  . Anemia   . Anxiety   . Panic attacks january 2013    Past Surgical History:  Procedure Laterality Date  . CESAREAN SECTION    . RIGHT OOPHORECTOMY      History reviewed. No pertinent family history.  Social History:  reports that  has never smoked. she has never used smokeless tobacco. She reports that she does not drink alcohol or use drugs.  Allergies: No Known Allergies  Medications: I have reviewed the patient's current medications.  Results for orders placed or performed during the hospital encounter of 11/11/17 (from the past 48 hour(s))  Basic metabolic panel     Status: Abnormal   Collection Time: 11/11/17 10:00 PM  Result Value Ref Range   Sodium 137 135 - 145 mmol/L   Potassium 3.4 (L) 3.5 - 5.1 mmol/L   Chloride 106 101 - 111 mmol/L   CO2 23 22 - 32  mmol/L   Glucose, Bld 113 (H) 65 - 99 mg/dL   BUN 11 6 - 20 mg/dL   Creatinine, Ser 0.66 0.44 - 1.00 mg/dL   Calcium 9.0 8.9 - 10.3 mg/dL   GFR calc non Af Amer >60 >60 mL/min   GFR calc Af Amer >60 >60 mL/min    Comment: (NOTE) The eGFR has been calculated using the CKD EPI equation. This calculation has not been validated in all clinical situations. eGFR's persistently <60 mL/min signify possible Chronic Kidney Disease.    Anion gap 8 5 - 15    Comment: Performed at [facility], [address] [place], [place] [address]  Troponin I  (0, 3)     Status: None   Collection Time: 11/11/17 10:00 PM  Result Value Ref Range   Troponin I <0.03 <0.03 ng/mL    Comment: Performed at [facility], [address] [place], [place] [address]  CBC     Status: None   Collection Time: 11/11/17 10:00 PM  Result Value Ref Range   WBC 9.2 3.6 - 11.0 K/uL   RBC 4.66 3.80 - 5.20 MIL/uL   Hemoglobin 13.3 12.0 - 16.0 g/dL   HCT 39.0 35.0 - 47.0 %   MCV 83.6 80.0 - 100.0 fL   MCH 28.6 26.0 - 34.0 pg   MCHC 34.2 32.0 - 36.0 g/dL   RDW 13.0 11.5 - 14.5 %   Platelets 277  150 - 440 K/uL    Comment: Performed at [facility], [address] [place], [place] [address]  Hepatic function panel     Status: Abnormal   Collection Time: 11/11/17 10:00 PM  Result Value Ref Range   Total Protein 7.9 6.5 - 8.1 g/dL   Albumin 3.9 3.5 - 5.0 g/dL   AST 19 15 - 41 U/L   ALT 20 14 - 54 U/L   Alkaline Phosphatase 61 38 - 126 U/L   Total Bilirubin 0.3 0.3 - 1.2 mg/dL   Bilirubin, Direct <0.1 (L) 0.1 - 0.5 mg/dL   Indirect Bilirubin NOT CALCULATED 0.3 - 0.9 mg/dL    Comment: Performed at [facility], [address] [place], [place] [address]  Lipase, blood     Status: None   Collection Time: 11/11/17 10:00 PM  Result Value Ref Range   Lipase 42 11 - 51 U/L    Comment: Performed at [facility], [address] [place], [place] [address]  Urinalysis, Complete  w Microscopic     Status: Abnormal   Collection Time: 11/12/17 12:15 AM  Result Value Ref Range   Color, Urine STRAW (A) YELLOW   APPearance CLEAR (A) CLEAR   Specific Gravity, Urine 1.028 1.005 - 1.030   pH 7.0 5.0 - 8.0   Glucose, UA NEGATIVE NEGATIVE mg/dL   Hgb urine dipstick NEGATIVE NEGATIVE   Bilirubin Urine NEGATIVE NEGATIVE   Ketones, ur NEGATIVE NEGATIVE mg/dL   Protein, ur NEGATIVE NEGATIVE mg/dL   Nitrite NEGATIVE NEGATIVE   Leukocytes, UA NEGATIVE NEGATIVE   RBC / HPF NONE SEEN 0 - 5 RBC/hpf   WBC, UA 0-5 0 - 5 WBC/hpf   Bacteria, UA RARE (A) NONE SEEN   Squamous Epithelial / LPF 0-5 (A) NONE SEEN    Comment: Performed at [facility], [address] [place], [place] [address]  Pregnancy, urine POC     Status: None   Collection Time: 11/12/17 12:23 AM  Result Value Ref Range   Preg Test, Ur NEGATIVE NEGATIVE    Comment:        THE SENSITIVITY OF THIS METHODOLOGY IS >24 mIU/mL   CBC     Status: Abnormal   Collection Time: 11/12/17  5:45 AM  Result Value Ref Range   WBC 6.0 3.6 - 11.0 K/uL   RBC 4.00 3.80 - 5.20 MIL/uL   Hemoglobin 11.3 (L) 12.0 - 16.0 g/dL   HCT 33.8 (L) 35.0 - 47.0 %   MCV 84.6 80.0 - 100.0 fL   MCH 28.2 26.0 - 34.0 pg   MCHC 33.4 32.0 - 36.0 g/dL   RDW 13.1 11.5 - 14.5 %   Platelets 210 150 - 440 K/uL    Comment: Performed at [facility], [address] [place], [place] [address]  Troponin I     Status: None   Collection Time: 11/12/17  5:45 AM  Result Value Ref Range   Troponin I <0.03 <0.03 ng/mL    Comment: Performed at [facility], [address] [place],  [address]  Basic metabolic panel     Status: Abnormal   Collection Time: 11/12/17  5:45 AM  Result Value Ref Range   Sodium 139 135 - 145 mmol/L   Potassium 3.4 (L) 3.5 - 5.1 mmol/L   Chloride 113 (H) 101 - 111 mmol/L   CO2 21 (L) 22 - 32 mmol/L   Glucose, Bld 91 65 - 99 mg/dL  BUN 7 6 - 20 mg/dL   Creatinine, Ser 0.66 0.44 - 1.00  mg/dL   Calcium 7.7 (L) 8.9 - 10.3 mg/dL   GFR calc non Af Amer >60 >60 mL/min   GFR calc Af Amer >60 >60 mL/min    Comment: (NOTE) The eGFR has been calculated using the CKD EPI equation. This calculation has not been validated in all clinical situations. eGFR's persistently <60 mL/min signify possible Chronic Kidney Disease.    Anion gap 5 5 - 15    Comment: Performed at [facility], [address] [place], [place] [address]  Lipid panel     Status: None   Collection Time: 11/12/17  5:45 AM  Result Value Ref Range   Cholesterol 156 0 - 200 mg/dL   Triglycerides 81 <150 mg/dL   HDL 42 >40 mg/dL   Total CHOL/HDL Ratio 3.7 RATIO   VLDL 16 0 - 40 mg/dL   LDL Cholesterol 98 0 - 99 mg/dL    Comment:        Total Cholesterol/HDL:CHD Risk Coronary Heart Disease Risk Table                     Men   Women  1/2 Average Risk   3.4   3.3  Average Risk       5.0   4.4  2 X Average Risk   9.6   7.1  3 X Average Risk  23.4   11.0        Use the calculated Patient Ratio above and the CHD Risk Table to determine the patient's CHD Risk.        ATP III CLASSIFICATION (LDL):  <100     mg/dL   Optimal  100-129  mg/dL   Near or Above                    Optimal  130-159  mg/dL   Borderline  160-189  mg/dL   High  >190     mg/dL   Very High Performed at [place], [place] [address]     Ct Abdomen Pelvis Wo Contrast  Result Date: 11/12/2017 CLINICAL DATA:  Acute onset of epigastric abdominal pain and palpitations. Dizziness. EXAM: CT ABDOMEN AND PELVIS WITHOUT CONTRAST TECHNIQUE: Multidetector CT imaging of the abdomen and pelvis was performed following the standard protocol without IV contrast. COMPARISON:  CT of the abdomen and pelvis performed 10/01/2010, and right upper quadrant ultrasound performed 10/08/2010 FINDINGS: Lower chest: The visualized lung bases are grossly clear. The visualized portions of the mediastinum are unremarkable.  Hepatobiliary: The liver is unremarkable in appearance. The gallbladder is unremarkable in appearance. The common bile duct remains normal in caliber. Pancreas: The pancreas is within normal limits. Spleen: The spleen is unremarkable in appearance. Adrenals/Urinary Tract: The adrenal glands are unremarkable in appearance. The kidneys are within normal limits. Residual contrast is noted within the renal calyces and ureters bilaterally. There is no evidence of hydronephrosis. No renal or ureteral stones are identified. No perinephric stranding is seen. Stomach/Bowel: The stomach is unremarkable in appearance. The small bowel is within normal limits. The appendix is normal in caliber, without evidence of appendicitis. The colon is unremarkable in appearance. Vascular/Lymphatic: The abdominal aorta is unremarkable in appearance. The inferior vena cava is grossly unremarkable. No retroperitoneal lymphadenopathy is seen. No pelvic sidewall lymphadenopathy is identified. Reproductive: The bladder is mildly distended and grossly unremarkable. The prominent appearance  of the uterus suggests underlying fibroids, though this is not well assessed without contrast. No suspicious adnexal masses are seen. Other: No additional soft tissue abnormalities are seen. Musculoskeletal: No acute osseous abnormalities are identified. The visualized musculature is unremarkable in appearance. IMPRESSION: 1. No acute abnormality seen to explain the patient's symptoms. 2. Suspect uterine fibroids, not well characterized without contrast. Electronically Signed   By: Garald Balding M.D.   On: 11/12/2017 03:36   Ct Angio Chest Pe W And/or Wo Contrast  Result Date: 11/11/2017 CLINICAL DATA:  46 year old female with dizziness. Concern for pulmonary embolism. EXAM: CT ANGIOGRAPHY CHEST WITH CONTRAST TECHNIQUE: Multidetector CT imaging of the chest was performed using the standard protocol during bolus administration of intravenous contrast.  Multiplanar CT image reconstructions and MIPs were obtained to evaluate the vascular anatomy. CONTRAST:  7m ISOVUE-370 IOPAMIDOL (ISOVUE-370) INJECTION 76% COMPARISON:  Chest radiograph dated 11/11/2017 FINDINGS: Cardiovascular: There is no cardiomegaly or pericardial effusion. The thoracic aorta appears unremarkable. The origins of the great vessels of the aortic arch appear patent. There is no CT evidence of pulmonary embolism. Mediastinum/Nodes: No hilar or mediastinal adenopathy. Esophagus and the thyroid gland are grossly unremarkable. No mediastinal fluid collection. Lungs/Pleura: The lungs are clear. There is no pleural effusion or pneumothorax. The central airways are patent. Upper Abdomen: No acute abnormality. Musculoskeletal: No chest wall abnormality. No acute or significant osseous findings. Review of the MIP images confirms the above findings. IMPRESSION: No acute intrathoracic pathology. No CT evidence of pulmonary embolism. Electronically Signed   By: AAnner CreteM.D.   On: 11/11/2017 23:37   Dg Chest Portable 1 View  Result Date: 11/11/2017 CLINICAL DATA:  Acute onset of epigastric abdominal pain. Palpitations and dizziness. EXAM: PORTABLE CHEST 1 VIEW COMPARISON:  Chest radiograph performed 08/20/2012 FINDINGS: The lungs are well-aerated and clear. There is no evidence of focal opacification, pleural effusion or pneumothorax. The cardiomediastinal silhouette is within normal limits. No acute osseous abnormalities are seen. IMPRESSION: No acute cardiopulmonary process seen. Electronically Signed   By: JGarald BaldingM.D.   On: 11/11/2017 22:30    Review of Systems  Constitutional: Positive for malaise/fatigue.  HENT: Negative.   Eyes: Negative.   Respiratory: Positive for shortness of breath.   Cardiovascular: Positive for chest pain and palpitations.  Gastrointestinal: Negative.   Genitourinary: Negative.   Musculoskeletal: Positive for myalgias.  Skin: Negative.    Neurological: Positive for weakness.  Endo/Heme/Allergies: Negative.   Psychiatric/Behavioral: The patient is nervous/anxious.    Blood pressure (!) 85/49, pulse 62, temperature 98.2 F (36.8 C), temperature source Oral, resp. rate 18, height '4\' 11"'  (1.499 m), weight 165 lb 1.6 oz (74.9 kg), last menstrual period 10/24/2017, SpO2 96 %. Physical Exam  Nursing note and vitals reviewed. Constitutional: She is oriented to person, place, and time. She appears well-developed and well-nourished.  HENT:  Head: Normocephalic and atraumatic.  Right Ear: External ear normal.  Eyes: Conjunctivae and EOM are normal. Pupils are equal, round, and reactive to light.  Neck: Normal range of motion. Neck supple.  Cardiovascular: Normal rate, regular rhythm and normal heart sounds.  Respiratory: Effort normal and breath sounds normal.  GI: Soft. Bowel sounds are normal.  Musculoskeletal: Normal range of motion.  Neurological: She is alert and oriented to person, place, and time. She has normal reflexes.  Skin: Skin is warm and dry.  Psychiatric: Judgment and thought content normal. Her mood appears anxious. Her speech is rapid and/or pressured.    Assessment/Plan: Atypical chest  pain Palpitations Anxiety Pernicious anemia Borderline obesity Panic attacks . Plan Agree with admission to telemetry Follow-up troponins and EKGs Recommend conservative medical therapy Study can be done as an outpatient We will follow-up holter report which was performed as an outpatient Continue anemia therapy Agree with diet and exercise for weight management Agree with echocardiogram for further assessment Have the patient follow-up with cardiology as an outpatient   Kishaun Erekson D Eleanor Dimichele 11/12/2017, 10:21 AM

## 2017-11-12 NOTE — Progress Notes (Signed)
[facility] NAME: Jenny Griffin    MR#:  [id number]  DATE OF BIRTH:  December 19, 1970  SUBJECTIVE:  CHIEF COMPLAINT:   Chief Complaint  Patient presents with  . Chest Pain  . Palpitations   -Patient presents with symptoms of chest pain radiating down her left arm and to the jaw, starting at rest and has been worse in the last week. Denies any stress or anxiety. No prior cardiac history. -First set of troponins is negative. Await echocardiogram and cardiology consultation.  REVIEW OF SYSTEMS:  Review of Systems  Constitutional: Negative for chills, fever and malaise/fatigue.  HENT: Negative for congestion, ear discharge, hearing loss and nosebleeds.   Respiratory: Negative for cough, shortness of breath and wheezing.   Cardiovascular: Positive for chest pain. Negative for palpitations.  Gastrointestinal: Positive for abdominal pain and nausea. Negative for constipation, diarrhea and vomiting.  Genitourinary: Negative for dysuria and urgency.  Musculoskeletal: Negative for myalgias.  Neurological: Negative for dizziness, speech change, focal weakness, seizures and headaches.  Psychiatric/Behavioral: Negative for depression.    DRUG ALLERGIES:  No Known Allergies  VITALS:  Blood pressure (!) 85/49, pulse 62, temperature 98.2 F (36.8 C), temperature source Oral, resp. rate 18, height 4\' 11"  (1.499 m), weight 74.9 kg (165 lb 1.6 oz), last menstrual period 10/24/2017, SpO2 96 %.  PHYSICAL EXAMINATION:  Physical Exam  GENERAL:  46 y.o.-year-old patient lying in the bed with no acute distress.  EYES: Pupils equal, round, reactive to light and accommodation. No scleral icterus. Extraocular muscles intact.  HEENT: Head atraumatic, normocephalic. Oropharynx and nasopharynx clear.  NECK:  Supple, no jugular venous distention. No thyroid enlargement, no tenderness.  LUNGS: Normal breath sounds bilaterally, no wheezing, rales,rhonchi or  crepitation. No use of accessory muscles of respiration.  CARDIOVASCULAR: S1, S2 normal. No murmurs, rubs, or gallops.  ABDOMEN: Soft, nontender, nondistended. Bowel sounds present. No organomegaly or mass.  EXTREMITIES: No pedal edema, cyanosis, or clubbing.  NEUROLOGIC: Cranial nerves II through XII are intact. Muscle strength 5/5 in all extremities. Sensation intact. Gait not checked.  PSYCHIATRIC: The patient is alert and oriented x 3.  SKIN: No obvious rash, lesion, or ulcer.    LABORATORY PANEL:   CBC Recent Labs  Lab 11/12/17 0545  WBC 6.0  HGB 11.3*  HCT 33.8*  PLT 210   ------------------------------------------------------------------------------------------------------------------  Chemistries  Recent Labs  Lab 11/11/17 2200 11/12/17 0545  NA 137 139  K 3.4* 3.4*  CL 106 113*  CO2 23 21*  GLUCOSE 113* 91  BUN 11 7  CREATININE 0.66 0.66  CALCIUM 9.0 7.7*  AST 19  --   ALT 20  --   ALKPHOS 61  --   BILITOT 0.3  --    ------------------------------------------------------------------------------------------------------------------  Cardiac Enzymes Recent Labs  Lab 11/12/17 0545  TROPONINI <0.03   ------------------------------------------------------------------------------------------------------------------  RADIOLOGY:  Ct Abdomen Pelvis Wo Contrast  Result Date: 11/12/2017 CLINICAL DATA:  Acute onset of epigastric abdominal pain and palpitations. Dizziness. EXAM: CT ABDOMEN AND PELVIS WITHOUT CONTRAST TECHNIQUE: Multidetector CT imaging of the abdomen and pelvis was performed following the standard protocol without IV contrast. COMPARISON:  CT of the abdomen and pelvis performed 10/01/2010, and right upper quadrant ultrasound performed 10/08/2010 FINDINGS: Lower chest: The visualized lung bases are grossly clear. The visualized portions of the mediastinum are unremarkable. Hepatobiliary: The liver is unremarkable in appearance. The gallbladder is  unremarkable in appearance. The common bile duct remains normal in caliber.  Pancreas: The pancreas is within normal limits. Spleen: The spleen is unremarkable in appearance. Adrenals/Urinary Tract: The adrenal glands are unremarkable in appearance. The kidneys are within normal limits. Residual contrast is noted within the renal calyces and ureters bilaterally. There is no evidence of hydronephrosis. No renal or ureteral stones are identified. No perinephric stranding is seen. Stomach/Bowel: The stomach is unremarkable in appearance. The small bowel is within normal limits. The appendix is normal in caliber, without evidence of appendicitis. The colon is unremarkable in appearance. Vascular/Lymphatic: The abdominal aorta is unremarkable in appearance. The inferior vena cava is grossly unremarkable. No retroperitoneal lymphadenopathy is seen. No pelvic sidewall lymphadenopathy is identified. Reproductive: The bladder is mildly distended and grossly unremarkable. The prominent appearance of the uterus suggests underlying fibroids, though this is not well assessed without contrast. No suspicious adnexal masses are seen. Other: No additional soft tissue abnormalities are seen. Musculoskeletal: No acute osseous abnormalities are identified. The visualized musculature is unremarkable in appearance. IMPRESSION: 1. No acute abnormality seen to explain the patient's symptoms. 2. Suspect uterine fibroids, not well characterized without contrast. Electronically Signed   By: Garald Balding M.D.   On: 11/12/2017 03:36   Ct Angio Chest Pe W And/or Wo Contrast  Result Date: 11/11/2017 CLINICAL DATA:  46 year old female with dizziness. Concern for pulmonary embolism. EXAM: CT ANGIOGRAPHY CHEST WITH CONTRAST TECHNIQUE: Multidetector CT imaging of the chest was performed using the standard protocol during bolus administration of intravenous contrast. Multiplanar CT image reconstructions and MIPs were obtained to evaluate the  vascular anatomy. CONTRAST:  79mL ISOVUE-370 IOPAMIDOL (ISOVUE-370) INJECTION 76% COMPARISON:  Chest radiograph dated 11/11/2017 FINDINGS: Cardiovascular: There is no cardiomegaly or pericardial effusion. The thoracic aorta appears unremarkable. The origins of the great vessels of the aortic arch appear patent. There is no CT evidence of pulmonary embolism. Mediastinum/Nodes: No hilar or mediastinal adenopathy. Esophagus and the thyroid gland are grossly unremarkable. No mediastinal fluid collection. Lungs/Pleura: The lungs are clear. There is no pleural effusion or pneumothorax. The central airways are patent. Upper Abdomen: No acute abnormality. Musculoskeletal: No chest wall abnormality. No acute or significant osseous findings. Review of the MIP images confirms the above findings. IMPRESSION: No acute intrathoracic pathology. No CT evidence of pulmonary embolism. Electronically Signed   By: Anner Crete M.D.   On: 11/11/2017 23:37   Dg Chest Portable 1 View  Result Date: 11/11/2017 CLINICAL DATA:  Acute onset of epigastric abdominal pain. Palpitations and dizziness. EXAM: PORTABLE CHEST 1 VIEW COMPARISON:  Chest radiograph performed 08/20/2012 FINDINGS: The lungs are well-aerated and clear. There is no evidence of focal opacification, pleural effusion or pneumothorax. The cardiomediastinal silhouette is within normal limits. No acute osseous abnormalities are seen. IMPRESSION: No acute cardiopulmonary process seen. Electronically Signed   By: Garald Balding M.D.   On: 11/11/2017 22:30    EKG:   Orders placed or performed during the hospital encounter of 11/11/17  . EKG 12-Lead  . EKG 12-Lead    ASSESSMENT AND PLAN:   46 year old female with no significant past medical history presents to hospital secondary to worsening chest pains at rest.  #1. Chest pain-not sure if it's is a esophageal spasm or anxiety related. -Patient denies any stress or anxiety. Chest pains have been starting at  rest and have been very typical with radiating down her left arm into her jaw, followed by diaphoresis and some palpitations. -Monitor on telemetry. No acute ST changes on EKG. -CT of the chest negative for pulmonary embolism. -Recycle  troponins. Check echocardiogram and cardiology consult. -If tests are negative, recommended outpatient stress test and Holter monitor. -Empirically on aspirin at this time  2. Hypokalemia-being replaced  3. Abdominal pain/nausea vomiting-started after CT chest with contrast. Denies any contrast allergies in the past. -A regular diet. Monitor and supportive medications.  4. DVT prophylaxis-Lovenox     All the records are reviewed and case discussed with Care Management/Social Workerr. Management plans discussed with the patient, family and they are in agreement.  CODE STATUS: Full code  TOTAL TIME TAKING CARE OF THIS PATIENT: 38 minutes.   POSSIBLE D/C IN 1-2 DAYS, DEPENDING ON CLINICAL CONDITION.   Gladstone Lighter M.D on 11/12/2017 at 9:45 AM  Between 7am to 6pm - Pager - [phone]  After 6pm go to [email or web address] - password [email or web address] [facility]  Office  [phone]  CC: Primary care physician; [name], MD

## 2017-11-12 NOTE — ED Provider Notes (Signed)
-----------------------------------------   12:04 AM on 11/12/2017 -----------------------------------------  CTA chest interpreted per Dr. Quintella Reichert: No acute intrathoracic pathology. No CT evidence of pulmonary  embolism.   Patient complained of abdominal pain after returning to CT scan.  She had morphine without significant relief of symptoms.  Updated patient and spouse of CT chest results.  She is currently doubled over in pain.  Abdominal exam reveals diffuse, moderate pain, maximally at umbilicus, and right upper quadrant and left lower quadrant.  Last bowel movement was this evening which was normal for patient.  Abdominal surgical history includes tummy tuck only.  States the abdominal pain began a few minutes after IV contrast administration for CT scan.  She has had CT scans with IV contrast previously without this reaction.  Patient had no hives, angioedema or difficulty breathing.  She is tolerating secretions well.  This is an unusual reaction to IV contrast, especially so prolonged.  This is not patient's original chief complaint.  However, she is in quite a bit of pain.  Describes it as someone punching her in the stomach.  Will add Versed for muscle relaxation, add LFTs and lipase.  Consider abdominal imaging.  ----------------------------------------- 12:55 AM on 11/12/2017 -----------------------------------------  Patient still in severe pain after Versed administration.  Will dose fentanyl and proceed with CT abdomen/pelvis.   ----------------------------------------- 1:48 AM on 11/12/2017 -----------------------------------------  Patient in Trendelenburg secondary to transient hypotension.  IV fluids infusing.  Fentanyl held.  Complains of nausea; will administer Zofran.  Slowly working on oral contrast.  History of right-sided ovarian cyst multiple years ago.  ----------------------------------------- 3:50 AM on  11/12/2017 -----------------------------------------  Updated patient and spouse of CT imaging results.  Pressure has improved.  Patient is still uncomfortable.  Now having headache, chest and abdominal pain.  She is scared to take fentanyl given her low blood pressures.  Agrees to IV Toradol and Zofran.  Will discuss case with hospitalist to evaluate patient in the emergency department for admission.   Paulette Blanch, MD 11/12/17 [phone]

## 2017-11-12 NOTE — Progress Notes (Signed)
Pt. Arrived via stretcher, transferred to stretcher with stand by assist. Fiance at bedside. Pt. C/o of ABD pain, pt. Repositioned and medicated. Pt. C/o nausea, medicated. No SOB noted. HR SR in the 60's. General room orientation given. Instruction on how to use call bell and ascom system given. Pt. High fall risk related to dizziness and medication. Bed alarm on. NS IV fluids started at 122ml/hr. Call bell and phone within reach.

## 2017-11-12 NOTE — ED Notes (Signed)
Pt transport to 231

## 2017-11-12 NOTE — Progress Notes (Signed)
Pt to be disccharged today. Iv and tele removed. disch instructions given to pt and husband. Nausea improved. Pt has been up and about in room

## 2017-11-12 NOTE — H&P (Signed)
Jenny Griffin    MR#:  [id number]  DATE OF BIRTH:  05-Nov-1971  DATE OF ADMISSION:  11/11/2017  PRIMARY CARE PHYSICIAN: [name], MD   REQUESTING/REFERRING PHYSICIAN:   CHIEF COMPLAINT:   Chief Complaint  Patient presents with  . Chest Pain  . Palpitations    HISTORY OF PRESENT ILLNESS: Jenny Griffin  is a 46 y.o. female with a known history of anemia, panic attacks, anxiety disorder presented to the emergency room with chest pain for 1 week.  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  She recently saw [facility] cardiology and was put on a Holter monitor.  She noticed chest pain last night which was sharp in nature located in the left central chest radiated to the neck and left shoulder.  Pain was 6 out of 10 on a scale of 1-10.  Patient was evaluated in the emergency room for set of troponin was negative EKG was normal sinus rhythm with no ST segment changes.  She was evaluated with CT angiogram of the chest which showed no pulmonary embolism.  But after the CTA chest she developed abdominal pain which was diffuse and she received IV morphine and IV Versed for the pain.  Her blood pressure dropped after the pain medication and she was given IV fluids.  No complaints of any vomiting but has nausea.  Last bowel movement was yesterday.  No complaints of any shortness of breath, orthopnea.  Hospitalist service was consulted for further care.  Patient had stress test done in 2011 which was a normal study.  She was worked up with CT abdomen which showed no acute pathology.  PAST MEDICAL HISTORY:   Past Medical History:  Diagnosis Date  . Anemia   . Anxiety   . Panic attacks january 2013    PAST SURGICAL HISTORY:  Past Surgical History:  Procedure Laterality Date  . CESAREAN SECTION    . RIGHT OOPHORECTOMY      SOCIAL HISTORY:  Social History   Tobacco Use  .  Smoking status: Never Smoker  . Smokeless tobacco: Never Used  Substance Use Topics  . Alcohol use: No    FAMILY HISTORY: No family history on file.  DRUG ALLERGIES: No Known Allergies  REVIEW OF SYSTEMS:   CONSTITUTIONAL: No fever, fatigue or weakness.  EYES: No blurred or double vision.  EARS, NOSE, AND THROAT: No tinnitus or ear pain.  RESPIRATORY: No cough, shortness of breath, wheezing or hemoptysis.  CARDIOVASCULAR: Has chest pain, no orthopnea, edema.  GASTROINTESTINAL: Has nausea,no vomiting, diarrhea  Has abdominal pain.  GENITOURINARY: No dysuria, hematuria.  ENDOCRINE: No polyuria, nocturia,  HEMATOLOGY: No anemia, easy bruising or bleeding SKIN: No rash or lesion. MUSCULOSKELETAL: No joint pain or arthritis.   NEUROLOGIC: No tingling, numbness, weakness.  PSYCHIATRY: No anxiety or depression.   MEDICATIONS AT HOME:  Prior to Admission medications   Medication Sig Start Date End Date Taking? Authorizing Provider  estradiol (ESTRACE) 0.1 MG/GM vaginal cream Place 1 Applicatorful vaginally.  11/09/17  Yes [provider]  NASCOBAL 500 MCG/0.1ML SOLN  10/20/17  Yes [provider]  norethindrone-ethinyl estradiol (MICROGESTIN,JUNEL,LOESTRIN) 1-20 MG-MCG tablet  08/22/17  Yes [provider]      PHYSICAL EXAMINATION:   VITAL SIGNS: Blood pressure (!) 97/53, pulse 70, temperature 98.4 F (36.9 C), temperature source Oral, resp. rate (!) 22, height 4\' 11"  (1.499 m), weight  73.5 kg (162 lb), last menstrual period 10/24/2017, SpO2 96 %.  GENERAL:  46 y.o.-year-old patient lying in the bed with no acute distress.  EYES: Pupils equal, round, reactive to light and accommodation. No scleral icterus. Extraocular muscles intact.  HEENT: Head atraumatic, normocephalic. Oropharynx and nasopharynx clear.  NECK:  Supple, no jugular venous distention. No thyroid enlargement, no tenderness.  LUNGS: Normal breath sounds bilaterally, no wheezing,  rales,rhonchi or crepitation. No use of accessory muscles of respiration.  CARDIOVASCULAR: S1, S2 normal. No murmurs, rubs, or gallops.  ABDOMEN: Soft, tenderness around umbilicus, nondistended. Bowel sounds present. No organomegaly or mass.  EXTREMITIES: No pedal edema, cyanosis, or clubbing.  NEUROLOGIC: Cranial nerves II through XII are intact. Muscle strength 5/5 in all extremities. Sensation intact. Gait not checked.  PSYCHIATRIC: The patient is alert and oriented x 3.  SKIN: No obvious rash, lesion, or ulcer.   LABORATORY PANEL:   CBC Recent Labs  Lab 11/11/17 2200  WBC 9.2  HGB 13.3  HCT 39.0  PLT 277  MCV 83.6  MCH 28.6  MCHC 34.2  RDW 13.0   ------------------------------------------------------------------------------------------------------------------  Chemistries  Recent Labs  Lab 11/11/17 2200  NA 137  K 3.4*  CL 106  CO2 23  GLUCOSE 113*  BUN 11  CREATININE 0.66  CALCIUM 9.0  AST 19  ALT 20  ALKPHOS 61  BILITOT 0.3   ------------------------------------------------------------------------------------------------------------------ estimated creatinine clearance is 77.5 mL/min (by C-G formula based on SCr of 0.66 mg/dL). ------------------------------------------------------------------------------------------------------------------ No results for input(s): TSH, T4TOTAL, T3FREE, THYROIDAB in the last 72 hours.  Invalid input(s): FREET3   Coagulation profile No results for input(s): INR, PROTIME in the last 168 hours. ------------------------------------------------------------------------------------------------------------------- No results for input(s): DDIMER in the last 72 hours. -------------------------------------------------------------------------------------------------------------------  Cardiac Enzymes Recent Labs  Lab 11/11/17 2200  TROPONINI <0.03    ------------------------------------------------------------------------------------------------------------------ Invalid input(s): POCBNP  ---------------------------------------------------------------------------------------------------------------  Urinalysis    Component Value Date/Time   COLORURINE STRAW (A) 11/12/2017 0015   APPEARANCEUR CLEAR (A) 11/12/2017 0015   LABSPEC 1.028 11/12/2017 0015   PHURINE 7.0 11/12/2017 0015   GLUCOSEU NEGATIVE 11/12/2017 0015   HGBUR NEGATIVE 11/12/2017 0015   BILIRUBINUR NEGATIVE 11/12/2017 0015   KETONESUR NEGATIVE 11/12/2017 0015   PROTEINUR NEGATIVE 11/12/2017 0015   NITRITE NEGATIVE 11/12/2017 0015   LEUKOCYTESUR NEGATIVE 11/12/2017 0015     RADIOLOGY: Ct Abdomen Pelvis Wo Contrast  Result Date: 11/12/2017 CLINICAL DATA:  Acute onset of epigastric abdominal pain and palpitations. Dizziness. EXAM: CT ABDOMEN AND PELVIS WITHOUT CONTRAST TECHNIQUE: Multidetector CT imaging of the abdomen and pelvis was performed following the standard protocol without IV contrast. COMPARISON:  CT of the abdomen and pelvis performed 10/01/2010, and right upper quadrant ultrasound performed 10/08/2010 FINDINGS: Lower chest: The visualized lung bases are grossly clear. The visualized portions of the mediastinum are unremarkable. Hepatobiliary: The liver is unremarkable in appearance. The gallbladder is unremarkable in appearance. The common bile duct remains normal in caliber. Pancreas: The pancreas is within normal limits. Spleen: The spleen is unremarkable in appearance. Adrenals/Urinary Tract: The adrenal glands are unremarkable in appearance. The kidneys are within normal limits. Residual contrast is noted within the renal calyces and ureters bilaterally. There is no evidence of hydronephrosis. No renal or ureteral stones are identified. No perinephric stranding is seen. Stomach/Bowel: The stomach is unremarkable in appearance. The small bowel is within  normal limits. The appendix is normal in caliber, without evidence of appendicitis. The colon is unremarkable in appearance. Vascular/Lymphatic: The abdominal aorta is unremarkable in  appearance. The inferior vena cava is grossly unremarkable. No retroperitoneal lymphadenopathy is seen. No pelvic sidewall lymphadenopathy is identified. Reproductive: The bladder is mildly distended and grossly unremarkable. The prominent appearance of the uterus suggests underlying fibroids, though this is not well assessed without contrast. No suspicious adnexal masses are seen. Other: No additional soft tissue abnormalities are seen. Musculoskeletal: No acute osseous abnormalities are identified. The visualized musculature is unremarkable in appearance. IMPRESSION: 1. No acute abnormality seen to explain the patient's symptoms. 2. Suspect uterine fibroids, not well characterized without contrast. Electronically Signed   By: Garald Balding M.D.   On: 11/12/2017 03:36   Ct Angio Chest Pe W And/or Wo Contrast  Result Date: 11/11/2017 CLINICAL DATA:  46 year old female with dizziness. Concern for pulmonary embolism. EXAM: CT ANGIOGRAPHY CHEST WITH CONTRAST TECHNIQUE: Multidetector CT imaging of the chest was performed using the standard protocol during bolus administration of intravenous contrast. Multiplanar CT image reconstructions and MIPs were obtained to evaluate the vascular anatomy. CONTRAST:  37mL ISOVUE-370 IOPAMIDOL (ISOVUE-370) INJECTION 76% COMPARISON:  Chest radiograph dated 11/11/2017 FINDINGS: Cardiovascular: There is no cardiomegaly or pericardial effusion. The thoracic aorta appears unremarkable. The origins of the great vessels of the aortic arch appear patent. There is no CT evidence of pulmonary embolism. Mediastinum/Nodes: No hilar or mediastinal adenopathy. Esophagus and the thyroid gland are grossly unremarkable. No mediastinal fluid collection. Lungs/Pleura: The lungs are clear. There is no pleural effusion  or pneumothorax. The central airways are patent. Upper Abdomen: No acute abnormality. Musculoskeletal: No chest wall abnormality. No acute or significant osseous findings. Review of the MIP images confirms the above findings. IMPRESSION: No acute intrathoracic pathology. No CT evidence of pulmonary embolism. Electronically Signed   By: Anner Crete M.D.   On: 11/11/2017 23:37   Dg Chest Portable 1 View  Result Date: 11/11/2017 CLINICAL DATA:  Acute onset of epigastric abdominal pain. Palpitations and dizziness. EXAM: PORTABLE CHEST 1 VIEW COMPARISON:  Chest radiograph performed 08/20/2012 FINDINGS: The lungs are well-aerated and clear. There is no evidence of focal opacification, pleural effusion or pneumothorax. The cardiomediastinal silhouette is within normal limits. No acute osseous abnormalities are seen. IMPRESSION: No acute cardiopulmonary process seen. Electronically Signed   By: Garald Balding M.D.   On: 11/11/2017 22:30    EKG: Orders placed or performed during the hospital encounter of 11/11/17  . EKG 12-Lead  . EKG 12-Lead    IMPRESSION AND PLAN: 46 year old female patient with history of anxiety disorder, panic attacks presented to the emergency room with chest pain.  Admitting diagnosis 1.  Chest pain 2 abdominal pain. 3.  Intractable nausea 4.  Hypotension 5.  Anxiety disorder Treatment plan Admit patient to telemetry observation bed IV fluid hydration Start patient on aspirin As needed nitrates for chest pain Check echocardiogram DVT prophylaxis subcu Lovenox Cardiology consultation  All the records are reviewed and case discussed with ED provider. Management plans discussed with the patient, family and they are in agreement.  CODE STATUS:FULL CODE Code Status History    This patient does not have a recorded code status. Please follow your organizational policy for patients in this situation.    Advance Directive Documentation     Most Recent Value  Type  of Advance Directive  Healthcare Power of Attorney  Pre-existing out of facility DNR order (yellow form or pink MOST form)  No data  "MOST" Form in Place?  No data       TOTAL TIME TAKING CARE OF THIS PATIENT: 73  minutes.    Saundra Shelling M.D on 11/12/2017 at 4:30 AM  Between 7am to 6pm - Pager - [phone]  After 6pm go to [email or web address] - password [email or web address] [facility]  Office  [phone]  CC: Primary care physician; [name], MD

## 2017-11-12 NOTE — ED Notes (Signed)
Pt given ice chips MD aware

## 2017-11-13 LAB — HIV ANTIBODY (ROUTINE TESTING W REFLEX): HIV SCREEN 4TH GENERATION: NONREACTIVE

## 2017-11-13 NOTE — Discharge Summary (Signed)
[facility] [place] NAME: Jenny Griffin    MR#:  [id number]  DATE OF BIRTH:  07-07-1971  DATE OF ADMISSION:  11/11/2017   ADMITTING PHYSICIAN: Saundra Shelling, MD  DATE OF DISCHARGE: 11/12/2017  4:10 PM  PRIMARY CARE PHYSICIAN: [name], MD   ADMISSION DIAGNOSIS:   Fibroids [D21.9] Generalized abdominal pain [R10.84] Chest pain, unspecified type [R07.9]  DISCHARGE DIAGNOSIS:   Active Problems:   Chest pain   SECONDARY DIAGNOSIS:   Past Medical History:  Diagnosis Date  . Anemia   . Anxiety   . Panic attacks january 2013    HOSPITAL COURSE:   46 year old female with no significant past medical history presents to hospital secondary to worsening chest pains at rest.  #1. Chest pain-not sure if it's is a esophageal spasm or anxiety related. -Patient denies any stress or anxiety. Chest pains have been starting at rest and have been very typical with radiating down her left arm into her jaw, followed by diaphoresis and some palpitations. -Normal rhythm on telemetry. No acute ST changes on EKG. -CT of the chest negative for pulmonary embolism. -Negative troponins. Appreciate cardiology consult. -Echocardiogram is done and result is pending -Symptoms have improved at this time. Outpatient stress test and Holter monitor recommended-  2. Hypokalemia-replaced  3. Abdominal pain/nausea vomiting-started after CT chest with contrast. Denies any contrast allergies in the past. -Tolerating a regular diet now. Pain and nausea have improved.  Stable and ambulated well prior to discharge    DISCHARGE CONDITIONS:   Guarded  CONSULTS OBTAINED:   Treatment Team:  Yolonda Kida, MD  DRUG ALLERGIES:   No Known Allergies DISCHARGE MEDICATIONS:   Allergies as of 11/12/2017   No Known Allergies     Medication List    TAKE these medications   estradiol 0.1 MG/GM vaginal cream Commonly known as:   ESTRACE Place 1 Applicatorful vaginally.   NASCOBAL 500 MCG/0.1ML Soln Generic drug:  Cyanocobalamin   norethindrone-ethinyl estradiol 1-20 MG-MCG tablet Commonly known as:  MICROGESTIN,JUNEL,LOESTRIN        DISCHARGE INSTRUCTIONS:   1. PCP f/u in 1-2 weeks  DIET:   Regular diet  ACTIVITY:   Activity as tolerated  OXYGEN:   Home Oxygen: No.  Oxygen Delivery: room air  DISCHARGE LOCATION:   home   If you experience worsening of your admission symptoms, develop shortness of breath, life threatening emergency, suicidal or homicidal thoughts you must seek medical attention immediately by calling 911 or calling your MD immediately  if symptoms less severe.  You Must read complete instructions/literature along with all the possible adverse reactions/side effects for all the Medicines you take and that have been prescribed to you. Take any new Medicines after you have completely understood and accpet all the possible adverse reactions/side effects.   Please note  You were cared for by a hospitalist during your hospital stay. If you have any questions about your discharge medications or the care you received while you were in the hospital after you are discharged, you can call the unit and asked to speak with the hospitalist on call if the hospitalist that took care of you is not available. Once you are discharged, your primary care physician will handle any further medical issues. Please note that NO REFILLS for any discharge medications will be authorized once you are discharged, as it is imperative that you return to your primary care physician (or establish a relationship with a primary  care physician if you do not have one) for your aftercare needs so that they can reassess your need for medications and monitor your lab values.    On the day of Discharge:  VITAL SIGNS:   Blood pressure (!) 85/49, pulse 62, temperature 98.2 F (36.8 C), temperature source Oral, resp. rate  18, height 4\' 11"  (1.499 m), weight 74.9 kg (165 lb 1.6 oz), last menstrual period 10/24/2017, SpO2 96 %.  PHYSICAL EXAMINATION:    GENERAL:  46 y.o.-year-old patient lying in the bed with no acute distress.  EYES: Pupils equal, round, reactive to light and accommodation. No scleral icterus. Extraocular muscles intact.  HEENT: Head atraumatic, normocephalic. Oropharynx and nasopharynx clear.  NECK:  Supple, no jugular venous distention. No thyroid enlargement, no tenderness.  LUNGS: Normal breath sounds bilaterally, no wheezing, rales,rhonchi or crepitation. No use of accessory muscles of respiration.  CARDIOVASCULAR: S1, S2 normal. No murmurs, rubs, or gallops.  ABDOMEN: Soft, nontender, nondistended. Bowel sounds present. No organomegaly or mass.  EXTREMITIES: No pedal edema, cyanosis, or clubbing.  NEUROLOGIC: Cranial nerves II through XII are intact. Muscle strength 5/5 in all extremities. Sensation intact. Gait not checked.  PSYCHIATRIC: The patient is alert and oriented x 3.  SKIN: No obvious rash, lesion, or ulcer.     DATA REVIEW:   CBC Recent Labs  Lab 11/12/17 0545  WBC 6.0  HGB 11.3*  HCT 33.8*  PLT 210    Chemistries  Recent Labs  Lab 11/11/17 2200 11/12/17 0545  NA 137 139  K 3.4* 3.4*  CL 106 113*  CO2 23 21*  GLUCOSE 113* 91  BUN 11 7  CREATININE 0.66 0.66  CALCIUM 9.0 7.7*  AST 19  --   ALT 20  --   ALKPHOS 61  --   BILITOT 0.3  --      Microbiology Results  Results for orders placed or performed during the hospital encounter of 10/26/10  Bone marrow exam     Status: None   Collection Time: 10/26/10  8:15 AM  Result Value Ref Range Status   Bone Marrow Exam SEE PATHOLOGY REPORT [id number]  Final    RADIOLOGY:  No results found.   Management plans discussed with the patient, family and they are in agreement.  CODE STATUS:  Code Status History    Date Active Date Inactive Code Status Order ID Comments User Context   11/12/2017 05:16  11/12/2017 19:16 Full Code [id number]  Saundra Shelling, MD Inpatient    Advance Directive Documentation     Most Recent Value  Type of Advance Directive  Healthcare Power of Attorney, Living will  Pre-existing out of facility DNR order (yellow form or pink MOST form)  No data  "MOST" Form in Place?  No data      TOTAL TIME TAKING CARE OF THIS PATIENT: 38 minutes.    Gladstone Lighter M.D on 11/13/2017 at 11:58 AM  Between 7am to 6pm - Pager - [phone]  After 6pm go to [email or web address] - Proofreader  [facility]  Office  [phone]  CC: Primary care physician; [name], MD   Note: This dictation was prepared with Dragon dictation along with smaller phrase technology. Any transcriptional errors that result from this process are unintentional.

## 2017-11-14 LAB — ECHOCARDIOGRAM COMPLETE
CHL CUP MV DEC (S): 264
E/e' ratio: 6.6
EWDT: 264 ms
FS: 33 % (ref 28–44)
Height: 59 in
IV/PV OW: 0.76
LA vol: 39.8 mL
LADIAMINDEX: 2.11 cm/m2
LASIZE: 38 mm
LAVOLA4C: 27.6 mL
LAVOLIN: 22.1 mL/m2
LEFT ATRIUM END SYS DIAM: 38 mm
LV E/e' medial: 6.6
LV E/e'average: 6.6
LV PW d: 10.2 mm — AB (ref 0.6–1.1)
LV TDI E'LATERAL: 12.5
LV TDI E'MEDIAL: 9.25
LVELAT: 12.5 cm/s
MV pk A vel: 69.8 m/s
MV pk E vel: 82.5 m/s
MVAP: 2.86 cm2
MVPG: 3 mmHg
MVSPHT: 77 ms
RV LATERAL S' VELOCITY: 11.6 cm/s
TAPSE: 27.3 mm
Weight: 2641.6 oz

## 2018-08-23 NOTE — Progress Notes (Signed)
Pt presents today as a NP to establish care. Pt complains of irregular bleeding and discomfort. Pt also states she is having issues with sex, discomfort.

## 2018-08-24 ENCOUNTER — Encounter: Payer: Self-pay | Admitting: Obstetrics and Gynecology

## 2018-08-24 ENCOUNTER — Ambulatory Visit (INDEPENDENT_AMBULATORY_CARE_PROVIDER_SITE_OTHER): Payer: 59 | Admitting: Obstetrics and Gynecology

## 2018-08-24 VITALS — BP 114/75 | HR 90 | Ht 59.0 in | Wt 165.0 lb

## 2018-08-24 DIAGNOSIS — N941 Unspecified dyspareunia: Secondary | ICD-10-CM | POA: Diagnosis not present

## 2018-08-24 DIAGNOSIS — N946 Dysmenorrhea, unspecified: Secondary | ICD-10-CM

## 2018-08-24 DIAGNOSIS — D251 Intramural leiomyoma of uterus: Secondary | ICD-10-CM | POA: Diagnosis not present

## 2018-08-24 NOTE — Progress Notes (Signed)
HPI:      [name] is a 47 y.o. G4P0003 who LMP was Patient's last menstrual period was 08/09/2018 (exact date).  Subjective:   She presents today with complaint of heavy menstrual bleeding and severe cramping each menstrual cycle.  She is currently taking OCPs but these have not helped with cycle control.  She states that she has uterine fibroids and she is "over it" and would like a hysterectomy.  She states that 2 previous doctors told her she needed a hysterectomy but she was not ready at that time.  She says she is ready now. She was informed that she was in menopause approximately 6 months ago because she was amenorrheic for 6 months prior to that.  She was otherwise asymptomatic with menopause. She also has a complaint of lack of vaginal feeling.  She says this is new for her and OCPs helped slightly but no longer seem to be working.  She has desire but not stimulation.  She also states that she has significant pain with intercourse that she believes is from her uterine fibroids. She is specifically requesting hysterectomy. Significant history includes a previous history of 3 prior cesarean deliveries 1 of which was likely due to CPD.    Hx: The following portions of the patient's history were reviewed and updated as appropriate:             She  has a past medical history of Anemia, Anxiety, and Panic attacks (january 2013). She does not have any pertinent problems on file. She  has a past surgical history that includes Cesarean section and Right oophorectomy. Her family history is not on file. She  reports that she has never smoked. She has never used smokeless tobacco. She reports that she does not drink alcohol or use drugs. She has a current medication list which includes the following prescription(s): estradiol, nascobal, and norethindrone-ethinyl estradiol. She has No Known Allergies.       Review of Systems:  Review of Systems  Constitutional: Denied constitutional  symptoms, night sweats, recent illness, fatigue, fever, insomnia and weight loss.  Eyes: Denied eye symptoms, eye pain, photophobia, vision change and visual disturbance.  Ears/Nose/Throat/Neck: Denied ear, nose, throat or neck symptoms, hearing loss, nasal discharge, sinus congestion and sore throat.  Cardiovascular: Denied cardiovascular symptoms, arrhythmia, chest pain/pressure, edema, exercise intolerance, orthopnea and palpitations.  Respiratory: Denied pulmonary symptoms, asthma, pleuritic pain, productive sputum, cough, dyspnea and wheezing.  Gastrointestinal: Denied, gastro-esophageal reflux, melena, nausea and vomiting.  Genitourinary: See HPI for additional information.  Musculoskeletal: Denied musculoskeletal symptoms, stiffness, swelling, muscle weakness and myalgia.  Dermatologic: Denied dermatology symptoms, rash and scar.  Neurologic: Denied neurology symptoms, dizziness, headache, neck pain and syncope.  Psychiatric: Denied psychiatric symptoms, anxiety and depression.  Endocrine: Denied endocrine symptoms including hot flashes and night sweats.   Meds:   Current Outpatient Medications on File Prior to Visit  Medication Sig Dispense Refill  . estradiol (ESTRACE) 0.1 MG/GM vaginal cream Place 1 Applicatorful vaginally.   10  . NASCOBAL 500 MCG/0.1ML SOLN   4  . norethindrone-ethinyl estradiol (MICROGESTIN,JUNEL,LOESTRIN) 1-20 MG-MCG tablet   2   No current facility-administered medications on file prior to visit.     Objective:     Vitals:   08/24/18 0956  BP: 114/75  Pulse: 90              Recent CT reveals the possibility of fibroids but not diagnostic.  Assessment:    G4P0003 Patient  Active Problem List   Diagnosis Date Noted  . Chest pain 11/12/2017  . Nausea alone 05/02/2012  . Pernicious anemia 09/16/2011     1. Fibroids, intramural   2. Dysmenorrhea   3. Dyspareunia in female     Patient has failed use of OCPs and continues to have significant  dysmenorrhea and menorrhagia.   Plan:            1.  I discussed multiple options with her including other forms of cycle control including IUD.  We have also discussed allowing her to stop OCPs and go into menopause which typically shrinks fibroids and would decrease her menorrhagia and dysmenorrhea.  She has distinctively declined this option.  2.  Ultrasound to delineate size of uterus and fibroids   3.  Likely to schedule LAVH BSO in the near future. Orders Orders Placed This Encounter  Procedures  . US Pelvis Complete    No orders of the defined types were placed in this encounter.     F/U  Return in about 2 weeks (around 09/07/2018). I spent 32 minutes involved in the care of this patient of which greater than 50% was spent discussing menopause, uterine fibroids natural course and history, dysmenorrhea, menorrhagia, other forms of treatment for uterine fibroids and symptoms, types of possible surgery including both abdominal and laparoscopic hysterectomy.  All questions answered.  Jenny Griffin, M.D. 08/24/2018 10:52 AM

## 2018-08-29 ENCOUNTER — Ambulatory Visit (INDEPENDENT_AMBULATORY_CARE_PROVIDER_SITE_OTHER): Payer: 59

## 2018-08-29 DIAGNOSIS — D250 Submucous leiomyoma of uterus: Secondary | ICD-10-CM | POA: Diagnosis not present

## 2018-08-29 DIAGNOSIS — D251 Intramural leiomyoma of uterus: Secondary | ICD-10-CM

## 2018-09-07 ENCOUNTER — Ambulatory Visit (INDEPENDENT_AMBULATORY_CARE_PROVIDER_SITE_OTHER): Payer: 59 | Admitting: Obstetrics and Gynecology

## 2018-09-07 ENCOUNTER — Encounter: Payer: 59 | Admitting: Obstetrics and Gynecology

## 2018-09-07 ENCOUNTER — Encounter: Payer: Self-pay | Admitting: Obstetrics and Gynecology

## 2018-09-07 VITALS — BP 120/80 | HR 79 | Ht 59.0 in | Wt 164.0 lb

## 2018-09-07 DIAGNOSIS — N938 Other specified abnormal uterine and vaginal bleeding: Secondary | ICD-10-CM | POA: Diagnosis not present

## 2018-09-07 DIAGNOSIS — Z01818 Encounter for other preprocedural examination: Secondary | ICD-10-CM

## 2018-09-07 DIAGNOSIS — D250 Submucous leiomyoma of uterus: Secondary | ICD-10-CM | POA: Diagnosis not present

## 2018-09-07 DIAGNOSIS — D251 Intramural leiomyoma of uterus: Secondary | ICD-10-CM

## 2018-09-07 MED ORDER — METRONIDAZOLE 500 MG PO TABS: 500.0000 mg | ORAL_TABLET | Freq: Two times a day (BID) | ORAL | 0 refills | Status: DC

## 2018-09-07 NOTE — Progress Notes (Signed)
Pt presents today for pre-op exam. Pt states she has had 3 bleeding episodes since her last visit.

## 2018-09-07 NOTE — H&P (Signed)
PRE-OPERATIVE HISTORY AND PHYSICAL EXAM  PCP:  [name], MD Subjective:   HPI:  Jenny Griffin is a 47 y.o. G4P0003.  Patient's last menstrual period was 08/06/2018 (exact date).  She presents today for a pre-op discussion and PE.  She has the following symptoms: Irregular vaginal bleeding, pain with intercourse.  Review of Systems:   Constitutional: Denied constitutional symptoms, night sweats, recent illness, fatigue, fever, insomnia and weight loss.  Eyes: Denied eye symptoms, eye pain, photophobia, vision change and visual disturbance.  Ears/Nose/Throat/Neck: Denied ear, nose, throat or neck symptoms, hearing loss, nasal discharge, sinus congestion and sore throat.  Cardiovascular: Denied cardiovascular symptoms, arrhythmia, chest pain/pressure, edema, exercise intolerance, orthopnea and palpitations.  Respiratory: Denied pulmonary symptoms, asthma, pleuritic pain, productive sputum, cough, dyspnea and wheezing.  Gastrointestinal: Denied, gastro-esophageal reflux, melena, nausea and vomiting.  Genitourinary: See HPI for additional information.  Musculoskeletal: Denied musculoskeletal symptoms, stiffness, swelling, muscle weakness and myalgia.  Dermatologic: Denied dermatology symptoms, rash and scar.  Neurologic: Denied neurology symptoms, dizziness, headache, neck pain and syncope.  Psychiatric: Denied psychiatric symptoms, anxiety and depression.  Endocrine: Denied endocrine symptoms including hot flashes and night sweats.   OB History  Gravida Para Term Preterm AB Living  4 0 0 0 0 3  SAB TAB Ectopic Multiple Live Births  0 0 0 0 0    # Outcome Date GA Lbr Len/2nd Weight Sex Delivery Anes PTL Lv  4 Gravida           3 Gravida           2 Gravida           1 [place]             Past Medical History:  Diagnosis Date  . Anemia   . Anxiety   . Panic attacks january 2013    Past Surgical History:  Procedure Laterality Date  . CESAREAN SECTION    .  RIGHT OOPHORECTOMY        SOCIAL HISTORY: Social History   Tobacco Use  Smoking Status Never Smoker  Smokeless Tobacco Never Used   Social History   Substance and Sexual Activity  Alcohol Use No   Social History   Substance and Sexual Activity  Drug Use No    History reviewed. No pertinent family history.  ALLERGIES:  Patient has no known allergies.  MEDS:   Current Outpatient Medications on File Prior to Visit  Medication Sig Dispense Refill  . estradiol (ESTRACE) 0.1 MG/GM vaginal cream Place 1 Applicatorful vaginally.   10  . NASCOBAL 500 MCG/0.1ML SOLN   4  . norethindrone-ethinyl estradiol (MICROGESTIN,JUNEL,LOESTRIN) 1-20 MG-MCG tablet   2   No current facility-administered medications on file prior to visit.     No orders of the defined types were placed in this encounter.    Physical examination BP 120/80   Pulse 79   Ht 4\' 11"  (1.499 m)   Wt 164 lb (74.4 kg)   LMP 08/06/2018 (Exact Date)   BMI 33.12 kg/m   General NAD, Conversant  HEENT Atraumatic; Op clear with mmm.  Normo-cephalic. Pupils reactive. Anicteric sclerae  Thyroid/Neck Smooth without nodularity or enlargement. Normal ROM.  Neck Supple.  Skin No rashes, lesions or ulceration. Normal palpated skin turgor. No nodularity.  Breasts: No masses or discharge.  Symmetric.  No axillary adenopathy.  Lungs: Clear to auscultation.No rales or wheezes. Normal Respiratory effort, no retractions.  Heart: NSR.  No  murmurs or rubs appreciated. No periferal edema  Abdomen: Soft.  Non-tender.  No masses.  No HSM. No hernia  Extremities: Moves all appropriately.  Normal ROM for age. No lymphadenopathy.  Neuro: Oriented to PPT.  Normal mood. Normal affect.     Pelvic:   Vulva: Normal appearance.  No lesions.  Vagina: No lesions or abnormalities noted.  Support: Normal pelvic support.  Urethra No masses tenderness or scarring.  Meatus Normal size without lesions or prolapse.  Cervix: Normal ectropion.   No lesions.  Anus: Normal exam.  No lesions.  Perineum: Normal exam.  No lesions.        Bimanual   Uterus: Normal size.  Non-tender.  Mobile.  AV.  Adnexae: No masses.  Non-tender to palpation.  Cul-de-sac: Negative for abnormality.   Assessment:   G4P0003 Patient Active Problem List   Diagnosis Date Noted  . Chest pain 11/12/2017  . Nausea alone 05/02/2012  . Pernicious anemia 09/16/2011    1. Fibroids, intramural   2. Fibroids, submucosal   3. DUB (dysfunctional uterine bleeding)     Patient has previously failed hormonal attempts at bleeding control.  With her multiple fibroids especially the submucosal fibroids control of her bleeding is unlikely in the future.  IUD is also contraindicated.  Plan:   Orders: No orders of the defined types were placed in this encounter.    1.  LAVH - LSO  This may be converted to a TAH as patient describes a previous history of abdominal adhesions that were so bad a previous surgeon converted a laparoscopic case to a laparotomy.  I have sent for old records and will make a determination after reading them.

## 2018-09-07 NOTE — Progress Notes (Signed)
PRE-OPERATIVE HISTORY AND PHYSICAL EXAM  PCP:  [name], MD Subjective:   HPI:  Jenny Griffin is a 47 y.o. G4P0003.  Patient's last menstrual period was 08/06/2018 (exact date).  She presents today for a pre-op discussion and PE.  She has the following symptoms: Irregular vaginal bleeding, pain with intercourse.  Review of Systems:   Constitutional: Denied constitutional symptoms, night sweats, recent illness, fatigue, fever, insomnia and weight loss.  Eyes: Denied eye symptoms, eye pain, photophobia, vision change and visual disturbance.  Ears/Nose/Throat/Neck: Denied ear, nose, throat or neck symptoms, hearing loss, nasal discharge, sinus congestion and sore throat.  Cardiovascular: Denied cardiovascular symptoms, arrhythmia, chest pain/pressure, edema, exercise intolerance, orthopnea and palpitations.  Respiratory: Denied pulmonary symptoms, asthma, pleuritic pain, productive sputum, cough, dyspnea and wheezing.  Gastrointestinal: Denied, gastro-esophageal reflux, melena, nausea and vomiting.  Genitourinary: See HPI for additional information.  Musculoskeletal: Denied musculoskeletal symptoms, stiffness, swelling, muscle weakness and myalgia.  Dermatologic: Denied dermatology symptoms, rash and scar.  Neurologic: Denied neurology symptoms, dizziness, headache, neck pain and syncope.  Psychiatric: Denied psychiatric symptoms, anxiety and depression.  Endocrine: Denied endocrine symptoms including hot flashes and night sweats.   OB History  Gravida Para Term Preterm AB Living  4 0 0 0 0 3  SAB TAB Ectopic Multiple Live Births  0 0 0 0 0    # Outcome Date GA Lbr Len/2nd Weight Sex Delivery Anes PTL Lv  4 Gravida           3 Gravida           2 Gravida           1 [place]             Past Medical History:  Diagnosis Date  . Anemia   . Anxiety   . Panic attacks january 2013    Past Surgical History:  Procedure Laterality Date  . CESAREAN SECTION    . RIGHT  OOPHORECTOMY        SOCIAL HISTORY: Social History   Tobacco Use  Smoking Status Never Smoker  Smokeless Tobacco Never Used   Social History   Substance and Sexual Activity  Alcohol Use No   Social History   Substance and Sexual Activity  Drug Use No    History reviewed. No pertinent family history.  ALLERGIES:  Patient has no known allergies.  MEDS:   Current Outpatient Medications on File Prior to Visit  Medication Sig Dispense Refill  . estradiol (ESTRACE) 0.1 MG/GM vaginal cream Place 1 Applicatorful vaginally.   10  . NASCOBAL 500 MCG/0.1ML SOLN   4  . norethindrone-ethinyl estradiol (MICROGESTIN,JUNEL,LOESTRIN) 1-20 MG-MCG tablet   2   No current facility-administered medications on file prior to visit.     No orders of the defined types were placed in this encounter.    Physical examination BP 120/80   Pulse 79   Ht 4\' 11"  (1.499 m)   Wt 164 lb (74.4 kg)   LMP 08/06/2018 (Exact Date)   BMI 33.12 kg/m   General NAD, Conversant  HEENT Atraumatic; Op clear with mmm.  Normo-cephalic. Pupils reactive. Anicteric sclerae  Thyroid/Neck Smooth without nodularity or enlargement. Normal ROM.  Neck Supple.  Skin No rashes, lesions or ulceration. Normal palpated skin turgor. No nodularity.  Breasts: No masses or discharge.  Symmetric.  No axillary adenopathy.  Lungs: Clear to auscultation.No rales or wheezes. Normal Respiratory effort, no retractions.  Heart: NSR.  No murmurs  or rubs appreciated. No periferal edema  Abdomen: Soft.  Non-tender.  No masses.  No HSM. No hernia  Extremities: Moves all appropriately.  Normal ROM for age. No lymphadenopathy.  Neuro: Oriented to PPT.  Normal mood. Normal affect.     Pelvic:   Vulva: Normal appearance.  No lesions.  Vagina: No lesions or abnormalities noted.  Support: Normal pelvic support.  Urethra No masses tenderness or scarring.  Meatus Normal size without lesions or prolapse.  Cervix: Normal ectropion.  No  lesions.  Anus: Normal exam.  No lesions.  Perineum: Normal exam.  No lesions.        Bimanual   Uterus: Normal size.  Non-tender.  Mobile.  AV.  Adnexae: No masses.  Non-tender to palpation.  Cul-de-sac: Negative for abnormality.   Assessment:   G4P0003 Patient Active Problem List   Diagnosis Date Noted  . Chest pain 11/12/2017  . Nausea alone 05/02/2012  . Pernicious anemia 09/16/2011    1. Fibroids, intramural   2. Fibroids, submucosal   3. DUB (dysfunctional uterine bleeding)     Patient has previously failed hormonal attempts at bleeding control.  With her multiple fibroids especially the submucosal fibroids control of her bleeding is unlikely in the future.  IUD is also contraindicated.  Plan:   Orders: No orders of the defined types were placed in this encounter.    1.  LAVH - LSO  This may be converted to a TAH as patient describes a previous history of abdominal adhesions that were so bad a previous surgeon converted a laparoscopic case to a laparotomy.  I have sent for old records and will make a determination after reading them.  Pre-op discussions regarding Risks and Benefits of her scheduled surgery.  LAVH The procedure of Laparoscopic Assisted Vaginal Hysterectomy was described to the patient in detail.  We reviewed the rationale for Hysterectomy and the patient was again informed of other nonsurgical management possibilities for her condition.  She has considered these other options, and desires a Hysterectomy.  We have reviewed the fact that Hysterectomy is permanent and that following the procedure she will not be able to become pregnant or bear children.  We have discussed the following risk factors specifically and the patient has also been informed that additional complications not mentioned may develop:  Damage to bowel, bladder, ureters or to other internal organs, bleeding, infection and the risk from anesthesia.  We have discussed the procedure itself in  detail and she has an informed understanding of this surgery.  We have also discussed the recovery period in which physical and sexual activity will be restricted for a varying degree of time, often 3 - 6 weeks. The Laparoscopic Portion of Hysterectomy has also been reviewed with the patient.  She understands how the laparoscope facilitates the procedure.  We have discussed the abdominal incisions and punctures that will be used.  We have also reviewed the increased Operating Room time often accompanying LAVH.  The slightly increased risk of complications secondary to abdominal punctures, and use of laparoscopic instrumentation has also been discussed in detail.I have answered all of her questions and I believe the patient has an informed understanding of the procedure of Laparoscopic Assisted Vaginal Hysterectomy. TAH The procedure of Total Abdominal Hysterectomy was described to the patient in detail.  We reviewed the rationale for Hysterectomy and the patient was again informed of other non-surgical management possibilities for her condition.  She has considered these other options, and desires a Hysterectomy.  We have reviewed the fact that Hysterectomy is permanent and that following the procedure she will not be able to become pregnant or bear children.  We have discussed the following risk factors specifically, and the patient has also been informed that additional complications not mentioned may develop;  damage to bowel, bladder, ureters, or to other internal organs, bleeding, infection and the risk from anesthesia.  We have discussed the procedure itself in detail and she has an informed understanding of this surgery.  We have also discussed the recovery period in which physical and sexual activity will be restricted for a varying degree of time, often 3 - 6 weeks.  I have answered all of her questions and I believe she has an informed understanding of Abdominal Hysterectomy. Oophorectomy The option  of Oophorectomy has been discussed with the patient.  Detailed risk/benefits have been reviewed.  The risks discussed include, but are not limited to, hemorrhage, infection, damage to ureter or other internal organ, and Ovarian Remnant Syndrome.  The benefits include a significant decrease in the risk of Ovarian Cancer and in benign Ovarian disease.  The risk of Ovarian CA has been estimated at 1 in 65.  This is a relatively small risk.  However, should Ovarian CA develop, it is often found late in the course of the disease.  We have also discussed the role of inheritance in the development of Ovarian disease.  Some women, who have close relatives with Ovarian CA, have a higher than 1 in 70 risk of Ovarian CA.  The benefits of Estrogen replacement therapy following Oophorectomy has been stressed.  If she is premenopausal, we have discussed the fact that this procedure will make her permanently sterile and that premature menopause will result if no ERT is begun.  I have answered all of her questions, and I believe that she has an adequate and informed understanding of the risks and benefits of Oophorectomy. Patient has elected to have her left ovary removed at the time of surgery.  I spent 42 minutes involved in the care of this patient of which greater than 50% was spent discussing uterine fibroids, natural course and history of fibroids, surgical possibilities including both laparoscopic and abdominal hysterectomy, she is previous surgical history which is very well with it for this case, the risks and benefits of surgery, expectations regarding postop recovery and hospital time.  All questions answered.  Finis Bud, M.D. 09/07/2018 11:24 AM

## 2018-09-07 NOTE — H&P (View-Only) (Signed)
PRE-OPERATIVE HISTORY AND PHYSICAL EXAM  PCP:  [name], MD Subjective:   HPI:  Jenny Griffin is a 47 y.o. G4P0003.  Patient's last menstrual period was 08/06/2018 (exact date).  She presents today for a pre-op discussion and PE.  She has the following symptoms: Irregular vaginal bleeding, pain with intercourse.  Review of Systems:   Constitutional: Denied constitutional symptoms, night sweats, recent illness, fatigue, fever, insomnia and weight loss.  Eyes: Denied eye symptoms, eye pain, photophobia, vision change and visual disturbance.  Ears/Nose/Throat/Neck: Denied ear, nose, throat or neck symptoms, hearing loss, nasal discharge, sinus congestion and sore throat.  Cardiovascular: Denied cardiovascular symptoms, arrhythmia, chest pain/pressure, edema, exercise intolerance, orthopnea and palpitations.  Respiratory: Denied pulmonary symptoms, asthma, pleuritic pain, productive sputum, cough, dyspnea and wheezing.  Gastrointestinal: Denied, gastro-esophageal reflux, melena, nausea and vomiting.  Genitourinary: See HPI for additional information.  Musculoskeletal: Denied musculoskeletal symptoms, stiffness, swelling, muscle weakness and myalgia.  Dermatologic: Denied dermatology symptoms, rash and scar.  Neurologic: Denied neurology symptoms, dizziness, headache, neck pain and syncope.  Psychiatric: Denied psychiatric symptoms, anxiety and depression.  Endocrine: Denied endocrine symptoms including hot flashes and night sweats.   OB History  Gravida Para Term Preterm AB Living  4 0 0 0 0 3  SAB TAB Ectopic Multiple Live Births  0 0 0 0 0    # Outcome Date GA Lbr Len/2nd Weight Sex Delivery Anes PTL Lv  4 Gravida           3 Gravida           2 Gravida           1 [place]             Past Medical History:  Diagnosis Date  . Anemia   . Anxiety   . Panic attacks january 2013    Past Surgical History:  Procedure Laterality Date  . CESAREAN SECTION    .  RIGHT OOPHORECTOMY        SOCIAL HISTORY: Social History   Tobacco Use  Smoking Status Never Smoker  Smokeless Tobacco Never Used   Social History   Substance and Sexual Activity  Alcohol Use No   Social History   Substance and Sexual Activity  Drug Use No    History reviewed. No pertinent family history.  ALLERGIES:  Patient has no known allergies.  MEDS:   Current Outpatient Medications on File Prior to Visit  Medication Sig Dispense Refill  . estradiol (ESTRACE) 0.1 MG/GM vaginal cream Place 1 Applicatorful vaginally.   10  . NASCOBAL 500 MCG/0.1ML SOLN   4  . norethindrone-ethinyl estradiol (MICROGESTIN,JUNEL,LOESTRIN) 1-20 MG-MCG tablet   2   No current facility-administered medications on file prior to visit.     No orders of the defined types were placed in this encounter.    Physical examination BP 120/80   Pulse 79   Ht 4\' 11"  (1.499 m)   Wt 164 lb (74.4 kg)   LMP 08/06/2018 (Exact Date)   BMI 33.12 kg/m   General NAD, Conversant  HEENT Atraumatic; Op clear with mmm.  Normo-cephalic. Pupils reactive. Anicteric sclerae  Thyroid/Neck Smooth without nodularity or enlargement. Normal ROM.  Neck Supple.  Skin No rashes, lesions or ulceration. Normal palpated skin turgor. No nodularity.  Breasts: No masses or discharge.  Symmetric.  No axillary adenopathy.  Lungs: Clear to auscultation.No rales or wheezes. Normal Respiratory effort, no retractions.  Heart: NSR.  No  murmurs or rubs appreciated. No periferal edema  Abdomen: Soft.  Non-tender.  No masses.  No HSM. No hernia  Extremities: Moves all appropriately.  Normal ROM for age. No lymphadenopathy.  Neuro: Oriented to PPT.  Normal mood. Normal affect.     Pelvic:   Vulva: Normal appearance.  No lesions.  Vagina: No lesions or abnormalities noted.  Support: Normal pelvic support.  Urethra No masses tenderness or scarring.  Meatus Normal size without lesions or prolapse.  Cervix: Normal ectropion.   No lesions.  Anus: Normal exam.  No lesions.  Perineum: Normal exam.  No lesions.        Bimanual   Uterus: Normal size.  Non-tender.  Mobile.  AV.  Adnexae: No masses.  Non-tender to palpation.  Cul-de-sac: Negative for abnormality.   Assessment:   G4P0003 Patient Active Problem List   Diagnosis Date Noted  . Chest pain 11/12/2017  . Nausea alone 05/02/2012  . Pernicious anemia 09/16/2011    1. Fibroids, intramural   2. Fibroids, submucosal   3. DUB (dysfunctional uterine bleeding)     Patient has previously failed hormonal attempts at bleeding control.  With her multiple fibroids especially the submucosal fibroids control of her bleeding is unlikely in the future.  IUD is also contraindicated.  Plan:   Orders: No orders of the defined types were placed in this encounter.    1.  LAVH - LSO  This may be converted to a TAH as patient describes a previous history of abdominal adhesions that were so bad a previous surgeon converted a laparoscopic case to a laparotomy.  I have sent for old records and will make a determination after reading them.

## 2018-09-08 ENCOUNTER — Telehealth: Payer: Self-pay

## 2018-09-08 NOTE — Telephone Encounter (Signed)
Left VM informing pt that FMLA papers are ready for her to pick up.

## 2018-09-25 ENCOUNTER — Encounter: Payer: Self-pay | Admitting: *Deleted

## 2018-09-25 ENCOUNTER — Encounter
Admission: RE | Admit: 2018-09-25 | Discharge: 2018-09-25 | Disposition: A | Discharge status: Home / Self Care | Payer: 59 | Source: Ambulatory Visit | Attending: Obstetrics and Gynecology | Admitting: Obstetrics and Gynecology

## 2018-09-25 ENCOUNTER — Other Ambulatory Visit: Payer: Self-pay

## 2018-09-25 HISTORY — DX: Gastro-esophageal reflux disease without esophagitis: K21.9

## 2018-09-25 HISTORY — DX: Other specified postprocedural states: Z98.890

## 2018-09-25 HISTORY — DX: Headache: R51

## 2018-09-25 HISTORY — DX: Headache, unspecified: R51.9

## 2018-09-25 HISTORY — DX: Adverse effect of unspecified anesthetic, initial encounter: T41.45XA

## 2018-09-25 HISTORY — DX: Other specified postprocedural states: R11.2

## 2018-09-25 HISTORY — DX: Other complications of anesthesia, initial encounter: T88.59XA

## 2018-09-25 NOTE — Pre-Procedure Instructions (Signed)
Jenny Griffin  ECHO COMPLETE WO IMAGING ENHANCING AGENT  Order# [id number]  Reading physician: Yolonda Kida, MD Ordering physician: Saundra Shelling, MD Study date: 11/12/17  Study Result   Result status: Final result                   *[place], [place] [address]                            [phone]  ------------------------------------------------------------------- Transthoracic Echocardiography  Patient:    Jenny, Griffin MR #:       [id number] Study Date: 11/12/2017 Gender:     F Age:        47 Height:     149.9 cm Weight:     74.9 kg BSA:        1.8 m^2 Pt. Status: Room:       241A   SONOGRAPHER  [facility]  ATTENDING    Gladstone Lighter  PERFORMING   [place]  ADMITTING    Pyreddy, Pavan  ORDERING     Pyreddy, Pavan  REFERRING    Pyreddy, Pavan  cc:  ------------------------------------------------------------------- LV EF: 60% -   65%  ------------------------------------------------------------------- Indications:      Chest pain 786.51.  ------------------------------------------------------------------- History:   PMH:  Anemia. Anxiety.  ------------------------------------------------------------------- Study Conclusions  - Left ventricle: The cavity size was normal. Wall thickness was   normal. Systolic function was normal. The estimated ejection   fraction was in the range of 60% to 65%. Wall motion was normal;   there were no regional wall motion abnormalities.  Impressions:  - Normal LVF   Normal Wall Motion   EF=60%   Normal Right side. Normal study. No cardiac source of emboli was   indentified.  ------------------------------------------------------------------- Study data:   Study status:  Routine.  Procedure:  Transthoracic echocardiography. Image quality was adequate. Transthoracic echocardiography.   M-mode, complete 2D, spectral Doppler, and color Doppler.  Birthdate:  Patient birthdate: [birth year].  Age:  Patient is 47 yr old.  Sex:  Gender: female. BMI: 33.3 kg/m^2.  Blood pressure:     85/49  Patient status: Inpatient.  Study date:  Study date: 11/12/2017. Study time: 10:08 AM.  Location:  Echo laboratory.  -------------------------------------------------------------------  ------------------------------------------------------------------- Left ventricle:  The cavity size was normal. Wall thickness was normal. Systolic function was normal. The estimated ejection fraction was in the range of 60% to 65%. Wall motion was normal; there were no regional wall motion abnormalities.  ------------------------------------------------------------------- Aortic valve:   Structurally normal valve. Trileaflet; normal thickness leaflets. Cusp separation was normal. Mobility was not restricted.  Doppler:  Transvalvular velocity was within the normal range. There was no stenosis. There was no regurgitation.  ------------------------------------------------------------------- Aorta:  The aorta was normal, not dilated, and non-diseased. Aortic root: The aortic root was normal in size.  ------------------------------------------------------------------- Mitral valve:   Structurally normal valve.   Leaflet separation was normal. Mobility was not restricted.  Doppler:  Transvalvular velocity was within the normal range. There was no evidence for stenosis. There was no regurgitation.    Valve area by pressure half-time: 2.86 cm^2. Indexed valve area  by pressure half-time: 1.59 cm^2/m^2.    Peak gradient (D): 3 mm Hg.  ------------------------------------------------------------------- Left atrium:  The atrium was normal in size.  ------------------------------------------------------------------- Atrial septum:  Poorly  visualized.  ------------------------------------------------------------------- Right ventricle:  The cavity size was normal. Wall thickness was normal. Systolic function was normal.  ------------------------------------------------------------------- Pulmonic valve:    Structurally normal valve. The valve appears to be grossly normal.   Cusp separation was normal.  Doppler: Transvalvular velocity was within the normal range. There was no evidence for stenosis. There was no regurgitation.  ------------------------------------------------------------------- Tricuspid valve:   Structurally normal valve.    Doppler: Transvalvular velocity was within the normal range. There was no regurgitation.  ------------------------------------------------------------------- Pulmonary artery:   The main pulmonary artery was normal-sized. Systolic pressure was within the normal range.  ------------------------------------------------------------------- Right atrium:  The atrium was normal in size.  ------------------------------------------------------------------- Pericardium:  Not well visualized. The pericardium was normal in appearance. There was no pericardial effusion.  ------------------------------------------------------------------- Systemic veins: Inferior vena cava: The vessel was normal in size.  ------------------------------------------------------------------- Post procedure conclusions Ascending Aorta:  - The aorta was normal, not dilated, and non-diseased.  ------------------------------------------------------------------- Measurements   Left ventricle                         Value          Reference  LV ID, ED, PLAX chordal                43.3  mm       43 - 52  LV ID, ES, PLAX chordal                29.2  mm       23 - 38  LV fx shortening, PLAX chordal         33    %        >=29  LV PW thickness, ED                    10.2  mm       ----------  IVS/LV  PW ratio, ED                    0.76           <=1.3  LV e&', lateral                         12.5  cm/s     ----------  LV E/e&', lateral                       6.6            ----------  LV e&', medial                          9.25  cm/s     ----------  LV E/e&', medial                        8.92           ----------  LV e&', average                         10.88 cm/s     ----------  LV E/e&', average  7.59           ----------    Ventricular septum                     Value          Reference  IVS thickness, ED                      7.74  mm       ----------    Aorta                                  Value          Reference  Aortic root ID, ED                     27    mm       ----------    Left atrium                            Value          Reference  LA ID, A-P, ES                         38    mm       ----------  LA ID/bsa, A-P                         2.11  cm/m^2   <=2.2  LA volume, S                           39.8  ml       ----------  LA volume/bsa, S                       22.1  ml/m^2   ----------  LA volume, ES, 1-p A4C                 27.6  ml       ----------  LA volume/bsa, ES, 1-p A4C             15.3  ml/m^2   ----------  LA volume, ES, 1-p A2C                 50    ml       ----------  LA volume/bsa, ES, 1-p A2C             27.8  ml/m^2   ----------    Mitral valve                           Value          Reference  Mitral E-wave peak velocity            82.5  cm/s     ----------  Mitral A-wave peak velocity            69.8  cm/s     ----------  Mitral deceleration time       (H)     264   ms       150 - 230  Mitral pressure half-time  77    ms       ----------  Mitral peak gradient, D                3     mm Hg    ----------  Mitral E/A ratio, peak                 1.2            ----------  Mitral valve area, PHT, DP             2.86  cm^2     ----------  Mitral valve area/bsa, PHT, DP         1.59  cm^2/m^2 ----------    Right  atrium                           Value          Reference  RA ID, S-I, ES, A4C            (H)     53.7  mm       34 - 49  RA area, ES, A4C                       15.5  cm^2     8.3 - 19.5  RA volume, ES, A/L                     37.2  ml       ----------  RA volume/bsa, ES, A/L                 20.7  ml/m^2   ----------    Systemic veins                         Value          Reference  Estimated CVP                          3     mm Hg    ----------    Right ventricle                        Value          Reference  TAPSE                                  27.3  mm       ----------  RV s&', lateral, S                      11.6  cm/s     ----------  Legend: (L)  and  (H)  mark values outside specified reference range.  ------------------------------------------------------------------- Prepared and Electronically Authenticated by  Lujean Amel, MD 2018-12-24T08:39:28  [facility] Images   Show images for ECHOCARDIOGRAM COMPLETE  Patient Information   Patient Name Jenny, Griffin Sex Female DOB Mar 27, 1971 SSN [id number]  Reason for Exam  Priority: Routine  Comments:   Surgical History   Surgical History   No past medical history on file.    Other Surgical History   Procedure Laterality Date Comment Source  CESAREAN SECTION    Provider  RIGHT OOPHORECTOMY    Provider  Patient Data   Height 59 in    BP 85/49 mmHg       Performing Technologist/Nurse   Performing Technologist/Nurse: Scherrie November  EF and Strain Measurements   Ejection Fraction  TDI e' lateral 12.5     TDI e' medial 9.25          2D Measurements   LV PW d 10.2 mm (Range: 0.6 - 1.1)    FS 33 % (Range: 28 - 44)    LA ID, A-P, ES 38 mm    LA diam end sys 38 mm    LA vol 39.8 mL    LA vol index 22.1 mL/m2    IVS/LV PW RATIO, ED .76     FS 33 % (Range: 28 - 44)        Right Ventricle Measurements   Lateral S' vel 11.6 cm/sec    TAPSE 27.3 mm            Left Atrium  Measurements   LA ID, A-P, ES 38 mm    LA vol 39.8 mL    LA vol index 22.1 mL/m2    LA vol A4C 27.6 ml        Mitral Valve Measurements   Stenosis  Peak grad 3 mmHg    P 1/2 time 77 ms    Area-P 1/2 2.86 cm2    MV pk A vel 69.8 m/s     Regurgitation  MV Dec 264     E decel time 264 msec     PISA-MS/PISA-MR  MV pk E vel 82.5 m/s             Implants    No active implants to display in this view.  Order-Level Documents - 11/11/2017:   Scan on 11/13/2017 1:32 PM by Default, Provider, MD      Encounter-Level Documents - 11/11/2017:   Document on 11/16/2017 5:12 AM by Earleen Reaper, RN: ED PB Billing Extract  Scan on 11/13/2017 1:55 PM by Default, Provider, MD  Document on 11/12/2017 3:58 PM by Madelynn Done, RN: IP After Visit Summary  Document on 11/12/2017 3:52 PM by Madelynn Done, RN: IP After Visit Summary  Scan on 11/12/2017 9:02 AM by Default, Provider, MD  Scan on 11/12/2017 5:40 AM by Default, Provider, MD  Electronic signature on 11/12/2017 12:52 AM - Signed  Scan on 11/12/2017 3:10 AM by Default, Provider, MD  Scan on 11/11/2017 11:10 PM by Default, Provider, MD  Scan on 11/11/2017 11:15 PM by Wonda Cheng: Estimated Patient Financial Obligation Summary      Signed   Electronically signed by Yolonda Kida, MD on 11/14/17 at 0839 EST  Printable Result Report   Result Report   External Result Report   External Result Report

## 2018-09-25 NOTE — Patient Instructions (Signed)
Your procedure is scheduled on: 10-02-18 MONDAY Report to Same Day Surgery 2nd floor medical mall [facility] Entr[facility]-take elevator on left to 2nd floor.  Check in with surgery information desk.) To find out your arrival time please call [phone] between 1PM - 3PM on 09-29-18 FRIDAY  Remember: Instructions that are not followed completely may result in serious medical risk, up to and including death, or upon the discretion of your surgeon and anesthesiologist your surgery may need to be rescheduled.    _x___ 1. Do not eat food after midnight the night before your procedure. NO GUM OR CANDY AFTER MIDNIGHT.  You may drink clear liquids up to 2 hours before you are scheduled to arrive at the hospital for your procedure.  Do not drink clear liquids within 2 hours of your scheduled arrival to the hospital.  Clear liquids include  --Water or Apple juice without pulp  --Clear carbohydrate beverage such as ClearFast or Gatorade  --Black Coffee or Clear Tea (No milk, no creamers, do not add anything to the coffee or Tea   ____Ensure clear carbohydrate drink on the way to the hospital for bariatric patients  ____Ensure clear carbohydrate drink 3 hours before surgery for Dr Dwyane Luo patients if physician instructed.    __x__ 2. No Alcohol for 24 hours before or after surgery.   __x__3. No Smoking or e-cigarettes for 24 prior to surgery.  Do not use any chewable tobacco products for at least 6 hour prior to surgery   ____  4. Bring all medications with you on the day of surgery if instructed.    __x__ 5. Notify your doctor if there is any change in your medical condition     (cold, fever, infections).    x___6. On the morning of surgery brush your teeth with toothpaste and water.  You may rinse your mouth with mouth wash if you wish.  Do not swallow any toothpaste or mouthwash.   Do not wear jewelry, make-up, hairpins, clips or nail polish.  Do not wear lotions, powders, or perfumes. You  may wear deodorant.  Do not shave 48 hours prior to surgery. Men may shave face and neck.  Do not bring valuables to the hospital.    [facility] is not responsible for any belongings or valuables.               Contacts, dentures or bridgework may not be worn into surgery.  Leave your suitcase in the car. After surgery it may be brought to your room.  For patients admitted to the hospital, discharge time is determined by your treatment team.  _  Patients discharged the day of surgery will not be allowed to drive home.  You will need someone to drive you home and stay with you the night of your procedure.    Please read over the following fact sheets that you were given:   [facility] Preparing for Surgery   ____ Take anti-hypertensive listed below, cardiac, seizure, asthma, anti-reflux and psychiatric medicines. These include:  1. NONE  2.  3.  4.  5.  6.  ____Fleets enema or Magnesium Citrate as directed.   _x___ Use CHG Soap or sage wipes as directed on instruction sheet   ____ Use inhalers on the day of surgery and bring to hospital day of surgery  ____ Stop Metformin and Janumet 2 days prior to surgery.    ____ Take 1/2 of usual insulin dose the night before surgery and none on  the morning surgery.   ____ Follow recommendations from Cardiologist, Pulmonologist or PCP regarding stopping Aspirin, Coumadin, Plavix ,Eliquis, Effient, or Pradaxa, and Pletal.  X____Stop Anti-inflammatories such as Advil, Aleve, Ibuprofen, Motrin, Naproxen, Naprosyn, Goodies powders or aspirin products NOW- OK to take Tylenol    ____ Stop supplements until after surgery.     ____ Bring C-Pap to the hospital.

## 2018-09-26 ENCOUNTER — Telehealth: Payer: Self-pay

## 2018-09-26 NOTE — Telephone Encounter (Signed)
Left VM for patient informing her that she will be in the hospital overnight after her surgery.

## 2018-09-27 ENCOUNTER — Telehealth: Payer: Self-pay | Admitting: Obstetrics and Gynecology

## 2018-09-27 NOTE — Telephone Encounter (Signed)
The patient called and stated that she would like to speak with her nurse Amber in regards to her needing Flagil sent into her pharmacy, because she needs to start taking the medication 5 days before her scheduled procedure per P Re op nurse, The patient is hoping to receive a call back sometime today if possible as well for confirmation that the medication has been sent to her pharmacy. Please advise.

## 2018-09-27 NOTE — Telephone Encounter (Signed)
The patient called and stated that she would like to speak with her nurse Amber in regards to her needing Flagil sent into her pharmacy, because she needs to start taking the medication 5 days before her scheduled procedure per P Re op nurse, The patient is hoping to receive a call back sometime today if possible as well for confirmation that the medication has been sent to her pharmacy. Please advise

## 2018-09-28 ENCOUNTER — Encounter
Admission: RE | Admit: 2018-09-28 | Discharge: 2018-09-28 | Disposition: A | Discharge status: Home / Self Care | Payer: Managed Care, Other (non HMO) | Source: Ambulatory Visit | Attending: Obstetrics and Gynecology | Admitting: Obstetrics and Gynecology

## 2018-09-28 ENCOUNTER — Other Ambulatory Visit: Payer: Self-pay

## 2018-09-28 DIAGNOSIS — D251 Intramural leiomyoma of uterus: Secondary | ICD-10-CM | POA: Diagnosis not present

## 2018-09-28 DIAGNOSIS — Z01818 Encounter for other preprocedural examination: Secondary | ICD-10-CM | POA: Insufficient documentation

## 2018-09-28 DIAGNOSIS — N938 Other specified abnormal uterine and vaginal bleeding: Secondary | ICD-10-CM | POA: Insufficient documentation

## 2018-09-28 DIAGNOSIS — D510 Vitamin B12 deficiency anemia due to intrinsic factor deficiency: Secondary | ICD-10-CM | POA: Insufficient documentation

## 2018-09-28 DIAGNOSIS — D250 Submucous leiomyoma of uterus: Secondary | ICD-10-CM

## 2018-09-28 LAB — CBC
HEMATOCRIT: 40.9 % (ref 36.0–46.0)
HEMOGLOBIN: 13.6 g/dL (ref 12.0–15.0)
MCH: 28.4 pg (ref 26.0–34.0)
MCHC: 33.3 g/dL (ref 30.0–36.0)
MCV: 85.4 fL (ref 80.0–100.0)
Platelets: 271 10*3/uL (ref 150–400)
RBC: 4.79 MIL/uL (ref 3.87–5.11)
RDW: 11.9 % (ref 11.5–15.5)
WBC: 5.2 10*3/uL (ref 4.0–10.5)
nRBC: 0 % (ref 0.0–0.2)

## 2018-09-28 LAB — TYPE AND SCREEN
ABO/RH(D): A POS
Antibody Screen: NEGATIVE

## 2018-09-28 LAB — HCG, QUANTITATIVE, PREGNANCY: hCG, Beta Chain, Quant, S: 1 m[IU]/mL (ref <5)

## 2018-09-28 MED ORDER — METRONIDAZOLE 500 MG PO TABS: 500.0000 mg | ORAL_TABLET | Freq: Two times a day (BID) | ORAL | 0 refills | Status: DC

## 2018-09-28 NOTE — Telephone Encounter (Signed)
Informed pt that her Rx was already at the pharmacy ready to be picked up. Pt was pleased.

## 2018-10-01 MED ORDER — CEFAZOLIN SODIUM-DEXTROSE 2-4 GM/100ML-% IV SOLN
2.0000 g | INTRAVENOUS | Status: AC
  Administered 2018-10-02: 2 g via INTRAVENOUS

## 2018-10-02 ENCOUNTER — Encounter
Admission: RE | Disposition: A | Discharge status: Home / Self Care | Payer: Self-pay | Source: Ambulatory Visit | Attending: Obstetrics and Gynecology

## 2018-10-02 ENCOUNTER — Other Ambulatory Visit: Payer: Self-pay

## 2018-10-02 ENCOUNTER — Ambulatory Visit: Payer: Managed Care, Other (non HMO) | Admitting: Certified Registered"

## 2018-10-02 ENCOUNTER — Observation Stay
Admission: RE | Admit: 2018-10-02 | Discharge: 2018-10-03 | Disposition: A | Discharge status: Home / Self Care | Payer: Managed Care, Other (non HMO) | Source: Ambulatory Visit | Attending: Obstetrics and Gynecology | Admitting: Obstetrics and Gynecology

## 2018-10-02 DIAGNOSIS — Z79899 Other long term (current) drug therapy: Secondary | ICD-10-CM | POA: Diagnosis not present

## 2018-10-02 DIAGNOSIS — D510 Vitamin B12 deficiency anemia due to intrinsic factor deficiency: Secondary | ICD-10-CM | POA: Insufficient documentation

## 2018-10-02 DIAGNOSIS — N800 Endometriosis of uterus: Secondary | ICD-10-CM | POA: Insufficient documentation

## 2018-10-02 DIAGNOSIS — Z793 Long term (current) use of hormonal contraceptives: Secondary | ICD-10-CM | POA: Diagnosis not present

## 2018-10-02 DIAGNOSIS — D250 Submucous leiomyoma of uterus: Principal | ICD-10-CM | POA: Diagnosis present

## 2018-10-02 DIAGNOSIS — F419 Anxiety disorder, unspecified: Secondary | ICD-10-CM | POA: Insufficient documentation

## 2018-10-02 DIAGNOSIS — D251 Intramural leiomyoma of uterus: Secondary | ICD-10-CM | POA: Diagnosis not present

## 2018-10-02 DIAGNOSIS — N938 Other specified abnormal uterine and vaginal bleeding: Secondary | ICD-10-CM | POA: Insufficient documentation

## 2018-10-02 HISTORY — PX: LAPAROSCOPIC VAGINAL HYSTERECTOMY WITH SALPINGO OOPHORECTOMY: SHX6681

## 2018-10-02 HISTORY — PX: CYSTOSCOPY: SHX5120

## 2018-10-02 LAB — ABO/RH: ABO/RH(D): A POS

## 2018-10-02 SURGERY — HYSTERECTOMY, VAGINAL, LAPAROSCOPY-ASSISTED, WITH SALPINGO-OOPHORECTOMY
Anesthesia: General | Laterality: Left

## 2018-10-02 MED ORDER — HYDROMORPHONE HCL 1 MG/ML IJ SOLN
INTRAMUSCULAR | Status: DC | PRN
  Administered 2018-10-02: 0.5 mg via INTRAVENOUS

## 2018-10-02 MED ORDER — LIDOCAINE HCL (PF) 2 % IJ SOLN
INTRAMUSCULAR | Status: AC
  Filled 2018-10-02: qty 10

## 2018-10-02 MED ORDER — CEFAZOLIN SODIUM-DEXTROSE 2-4 GM/100ML-% IV SOLN
INTRAVENOUS | Status: AC
  Filled 2018-10-02: qty 100

## 2018-10-02 MED ORDER — KETOROLAC TROMETHAMINE 30 MG/ML IJ SOLN: 30.0000 mg | Freq: Four times a day (QID) | INTRAMUSCULAR | Status: DC

## 2018-10-02 MED ORDER — KETOROLAC TROMETHAMINE 30 MG/ML IJ SOLN
30.0000 mg | Freq: Four times a day (QID) | INTRAMUSCULAR | Status: DC
  Administered 2018-10-02 – 2018-10-03 (×3): 30 mg via INTRAVENOUS
  Filled 2018-10-02 (×3): qty 1

## 2018-10-02 MED ORDER — LIDOCAINE HCL (CARDIAC) PF 100 MG/5ML IV SOSY
PREFILLED_SYRINGE | INTRAVENOUS | Status: DC | PRN
  Administered 2018-10-02: 80 mg via INTRAVENOUS

## 2018-10-02 MED ORDER — MIDAZOLAM HCL 2 MG/2ML IJ SOLN
2.0000 mg | Freq: Once | INTRAMUSCULAR | Status: AC
  Administered 2018-10-02: 2 mg via INTRAVENOUS

## 2018-10-02 MED ORDER — KETOROLAC TROMETHAMINE 30 MG/ML IJ SOLN
INTRAMUSCULAR | Status: DC | PRN
  Administered 2018-10-02: 30 mg via INTRAVENOUS

## 2018-10-02 MED ORDER — FENTANYL CITRATE (PF) 100 MCG/2ML IJ SOLN
INTRAMUSCULAR | Status: DC | PRN
  Administered 2018-10-02 (×2): 50 ug via INTRAVENOUS
  Administered 2018-10-02: 150 ug via INTRAVENOUS

## 2018-10-02 MED ORDER — ESTROGENS CONJUGATED 0.625 MG PO TABS
0.6250 mg | ORAL_TABLET | Freq: Every day | ORAL | Status: DC
  Administered 2018-10-02 – 2018-10-03 (×2): 0.625 mg via ORAL
  Filled 2018-10-02 (×2): qty 1

## 2018-10-02 MED ORDER — VASOPRESSIN 20 UNIT/ML IV SOLN
INTRAVENOUS | Status: AC
  Filled 2018-10-02: qty 1

## 2018-10-02 MED ORDER — ROCURONIUM BROMIDE 50 MG/5ML IV SOLN
INTRAVENOUS | Status: AC
  Filled 2018-10-02: qty 1

## 2018-10-02 MED ORDER — SODIUM CHLORIDE (PF) 0.9 % IJ SOLN
INTRAMUSCULAR | Status: AC
  Filled 2018-10-02: qty 50

## 2018-10-02 MED ORDER — FENTANYL CITRATE (PF) 100 MCG/2ML IJ SOLN
INTRAMUSCULAR | Status: AC
  Administered 2018-10-02: 25 ug via INTRAVENOUS
  Filled 2018-10-02: qty 2

## 2018-10-02 MED ORDER — FENTANYL CITRATE (PF) 100 MCG/2ML IJ SOLN
25.0000 ug | INTRAMUSCULAR | Status: AC | PRN
  Administered 2018-10-02 (×6): 25 ug via INTRAVENOUS

## 2018-10-02 MED ORDER — DEXAMETHASONE SODIUM PHOSPHATE 10 MG/ML IJ SOLN
INTRAMUSCULAR | Status: AC
  Filled 2018-10-02: qty 1

## 2018-10-02 MED ORDER — SCOPOLAMINE 1 MG/3DAYS TD PT72
MEDICATED_PATCH | TRANSDERMAL | Status: AC
  Administered 2018-10-02: 1.5 mg via TRANSDERMAL
  Filled 2018-10-02: qty 1

## 2018-10-02 MED ORDER — ONDANSETRON HCL 4 MG/2ML IJ SOLN
INTRAMUSCULAR | Status: AC
  Administered 2018-10-02: 4 mg via INTRAVENOUS
  Filled 2018-10-02: qty 2

## 2018-10-02 MED ORDER — BUPIVACAINE HCL 0.5 % IJ SOLN
INTRAMUSCULAR | Status: DC | PRN
  Administered 2018-10-02: 8 mL

## 2018-10-02 MED ORDER — MIDAZOLAM HCL 2 MG/2ML IJ SOLN
INTRAMUSCULAR | Status: AC
  Filled 2018-10-02: qty 2

## 2018-10-02 MED ORDER — DEXAMETHASONE SODIUM PHOSPHATE 10 MG/ML IJ SOLN
INTRAMUSCULAR | Status: DC | PRN
  Administered 2018-10-02: 8 mg via INTRAVENOUS

## 2018-10-02 MED ORDER — PROPOFOL 10 MG/ML IV BOLUS
INTRAVENOUS | Status: AC
  Filled 2018-10-02: qty 20

## 2018-10-02 MED ORDER — ONDANSETRON HCL 4 MG/2ML IJ SOLN
4.0000 mg | Freq: Once | INTRAMUSCULAR | Status: AC | PRN
  Administered 2018-10-02: 4 mg via INTRAVENOUS

## 2018-10-02 MED ORDER — MIDAZOLAM HCL 2 MG/2ML IJ SOLN
INTRAMUSCULAR | Status: DC | PRN
  Administered 2018-10-02: 2 mg via INTRAVENOUS

## 2018-10-02 MED ORDER — LACTATED RINGERS IV SOLN
INTRAVENOUS | Status: DC
  Administered 2018-10-02: 06:00:00 via INTRAVENOUS

## 2018-10-02 MED ORDER — SCOPOLAMINE 1 MG/3DAYS TD PT72
1.0000 | MEDICATED_PATCH | Freq: Once | TRANSDERMAL | Status: DC
  Administered 2018-10-02: 1.5 mg via TRANSDERMAL

## 2018-10-02 MED ORDER — FLUORESCEIN SODIUM 10 % IV SOLN
INTRAVENOUS | Status: DC | PRN
  Administered 2018-10-02: .5 mL via INTRAVENOUS

## 2018-10-02 MED ORDER — KETOROLAC TROMETHAMINE 30 MG/ML IJ SOLN: 30.0000 mg | Freq: Once | INTRAMUSCULAR | Status: DC

## 2018-10-02 MED ORDER — SIMETHICONE 80 MG PO CHEW
80.0000 mg | CHEWABLE_TABLET | Freq: Four times a day (QID) | ORAL | Status: DC | PRN
  Administered 2018-10-02 – 2018-10-03 (×4): 80 mg via ORAL
  Filled 2018-10-02 (×4): qty 1

## 2018-10-02 MED ORDER — PHENYLEPHRINE HCL 10 MG/ML IJ SOLN
INTRAMUSCULAR | Status: DC | PRN
  Administered 2018-10-02 (×2): 50 ug via INTRAVENOUS
  Administered 2018-10-02 (×3): 100 ug via INTRAVENOUS
  Administered 2018-10-02 (×3): 50 ug via INTRAVENOUS
  Administered 2018-10-02: 100 ug via INTRAVENOUS
  Administered 2018-10-02: 50 ug via INTRAVENOUS
  Administered 2018-10-02 (×3): 100 ug via INTRAVENOUS

## 2018-10-02 MED ORDER — FAMOTIDINE 20 MG PO TABS
ORAL_TABLET | ORAL | Status: AC
  Administered 2018-10-02: 20 mg via ORAL
  Filled 2018-10-02: qty 1

## 2018-10-02 MED ORDER — MIDAZOLAM HCL 2 MG/2ML IJ SOLN
INTRAMUSCULAR | Status: AC
  Administered 2018-10-02: 2 mg via INTRAVENOUS
  Filled 2018-10-02: qty 2

## 2018-10-02 MED ORDER — DIPHENHYDRAMINE HCL 50 MG/ML IJ SOLN
25.0000 mg | Freq: Four times a day (QID) | INTRAMUSCULAR | Status: DC | PRN
  Administered 2018-10-02 – 2018-10-03 (×2): 25 mg via INTRAVENOUS
  Filled 2018-10-02 (×2): qty 1

## 2018-10-02 MED ORDER — FLUORESCEIN SODIUM 10 % IV SOLN
INTRAVENOUS | Status: AC
  Filled 2018-10-02: qty 5

## 2018-10-02 MED ORDER — VASOPRESSIN 20 UNIT/ML IV SOLN
INTRAVENOUS | Status: DC | PRN
  Administered 2018-10-02: 9 mL via INTRAMUSCULAR

## 2018-10-02 MED ORDER — PROPOFOL 10 MG/ML IV BOLUS
INTRAVENOUS | Status: DC | PRN
  Administered 2018-10-02: 150 mg via INTRAVENOUS

## 2018-10-02 MED ORDER — HYDROMORPHONE HCL 1 MG/ML IJ SOLN
INTRAMUSCULAR | Status: AC
  Administered 2018-10-02: 0.5 mg via INTRAVENOUS
  Filled 2018-10-02: qty 1

## 2018-10-02 MED ORDER — ROCURONIUM BROMIDE 100 MG/10ML IV SOLN
INTRAVENOUS | Status: DC | PRN
  Administered 2018-10-02: 50 mg via INTRAVENOUS
  Administered 2018-10-02 (×2): 10 mg via INTRAVENOUS

## 2018-10-02 MED ORDER — SODIUM CHLORIDE 0.9 % IV SOLN
8.0000 mg | Freq: Three times a day (TID) | INTRAVENOUS | Status: DC | PRN
  Administered 2018-10-02 – 2018-10-03 (×2): 8 mg via INTRAVENOUS
  Filled 2018-10-02 (×2): qty 4

## 2018-10-02 MED ORDER — ONDANSETRON HCL 4 MG/2ML IJ SOLN
INTRAMUSCULAR | Status: DC | PRN
  Administered 2018-10-02: 4 mg via INTRAVENOUS

## 2018-10-02 MED ORDER — ACETAMINOPHEN NICU IV SYRINGE 10 MG/ML
INTRAVENOUS | Status: AC
  Filled 2018-10-02: qty 1

## 2018-10-02 MED ORDER — HYDROMORPHONE HCL 1 MG/ML IJ SOLN
INTRAMUSCULAR | Status: AC
  Filled 2018-10-02: qty 1

## 2018-10-02 MED ORDER — OXYCODONE-ACETAMINOPHEN 5-325 MG PO TABS
1.0000 | ORAL_TABLET | ORAL | Status: DC | PRN
  Administered 2018-10-03 (×4): 2 via ORAL
  Filled 2018-10-02 (×4): qty 2

## 2018-10-02 MED ORDER — BUPIVACAINE HCL (PF) 0.5 % IJ SOLN
INTRAMUSCULAR | Status: AC
  Filled 2018-10-02: qty 30

## 2018-10-02 MED ORDER — FAMOTIDINE 20 MG PO TABS
20.0000 mg | ORAL_TABLET | Freq: Once | ORAL | Status: AC
  Administered 2018-10-02: 20 mg via ORAL

## 2018-10-02 MED ORDER — LACTATED RINGERS IV SOLN: INTRAVENOUS | Status: DC

## 2018-10-02 MED ORDER — FENTANYL CITRATE (PF) 250 MCG/5ML IJ SOLN
INTRAMUSCULAR | Status: AC
  Filled 2018-10-02: qty 5

## 2018-10-02 MED ORDER — IBUPROFEN 600 MG PO TABS
600.0000 mg | ORAL_TABLET | Freq: Four times a day (QID) | ORAL | Status: DC | PRN
  Administered 2018-10-03 (×2): 600 mg via ORAL
  Filled 2018-10-02 (×2): qty 1

## 2018-10-02 MED ORDER — PHENYLEPHRINE HCL 10 MG/ML IJ SOLN
INTRAMUSCULAR | Status: AC
  Filled 2018-10-02: qty 1

## 2018-10-02 MED ORDER — SUGAMMADEX SODIUM 200 MG/2ML IV SOLN
INTRAVENOUS | Status: AC
  Filled 2018-10-02: qty 2

## 2018-10-02 MED ORDER — HYDROMORPHONE HCL 1 MG/ML IJ SOLN
0.5000 mg | INTRAMUSCULAR | Status: AC | PRN
  Administered 2018-10-02 (×4): 0.5 mg via INTRAVENOUS

## 2018-10-02 MED ORDER — DEXTROSE IN LACTATED RINGERS 5 % IV SOLN
INTRAVENOUS | Status: DC
  Administered 2018-10-02 – 2018-10-03 (×3): via INTRAVENOUS

## 2018-10-02 MED ORDER — ACETAMINOPHEN 10 MG/ML IV SOLN
INTRAVENOUS | Status: DC | PRN
  Administered 2018-10-02: 1000 mg via INTRAVENOUS

## 2018-10-02 MED ORDER — SUGAMMADEX SODIUM 200 MG/2ML IV SOLN
INTRAVENOUS | Status: DC | PRN
  Administered 2018-10-02: 150 mg via INTRAVENOUS

## 2018-10-02 MED ORDER — ONDANSETRON HCL 4 MG/2ML IJ SOLN
INTRAMUSCULAR | Status: AC
  Filled 2018-10-02: qty 2

## 2018-10-02 MED ORDER — HYDROMORPHONE HCL 1 MG/ML IJ SOLN
0.2000 mg | INTRAMUSCULAR | Status: AC | PRN
  Administered 2018-10-02 – 2018-10-03 (×7): 0.6 mg via INTRAVENOUS
  Filled 2018-10-02 (×7): qty 1

## 2018-10-02 SURGICAL SUPPLY — 42 items
ADHESIVE MASTISOL STRL (MISCELLANEOUS) ×4 IMPLANT
BAG URINE DRAINAGE (UROLOGICAL SUPPLIES) ×4 IMPLANT
BLADE SURG SZ11 CARB STEEL (BLADE) ×4 IMPLANT
CATH FOLEY 2WAY  5CC 16FR (CATHETERS) ×2
CATH URTH 16FR FL 2W BLN LF (CATHETERS) ×2 IMPLANT
CHLORAPREP W/TINT 26ML (MISCELLANEOUS) ×4 IMPLANT
COVER WAND RF STERILE (DRAPES) ×4 IMPLANT
DEFOGGER SCOPE WARMER CLEARIFY (MISCELLANEOUS) ×4 IMPLANT
DERMABOND ADVANCED (GAUZE/BANDAGES/DRESSINGS) ×2
DERMABOND ADVANCED .7 DNX12 (GAUZE/BANDAGES/DRESSINGS) ×2 IMPLANT
ELECT REM PT RETURN 9FT ADLT (ELECTROSURGICAL) ×4
ELECTRODE REM PT RTRN 9FT ADLT (ELECTROSURGICAL) ×2 IMPLANT
GLOVE BIOGEL PI ORTHO PRO 7.5 (GLOVE) ×8
GLOVE PI ORTHO PRO STRL 7.5 (GLOVE) ×8 IMPLANT
GLOVE SURG SYN 8.0 (GLOVE) ×8 IMPLANT
GOWN STRL REUS W/ TWL LRG LVL3 (GOWN DISPOSABLE) ×2 IMPLANT
GOWN STRL REUS W/ TWL XL LVL3 (GOWN DISPOSABLE) ×4 IMPLANT
GOWN STRL REUS W/TWL LRG LVL3 (GOWN DISPOSABLE) ×2
GOWN STRL REUS W/TWL XL LVL3 (GOWN DISPOSABLE) ×4
HANDLE YANKAUER SUCT BULB TIP (MISCELLANEOUS) ×4 IMPLANT
IRRIGATION STRYKERFLOW (MISCELLANEOUS) IMPLANT
IRRIGATOR STRYKERFLOW (MISCELLANEOUS)
IV LACTATED RINGERS 1000ML (IV SOLUTION) ×4 IMPLANT
KIT PINK PAD W/HEAD ARE REST (MISCELLANEOUS) ×4
KIT PINK PAD W/HEAD ARM REST (MISCELLANEOUS) ×2 IMPLANT
KIT TURNOVER CYSTO (KITS) ×4 IMPLANT
LIGASURE LAP MARYLAND 5MM 37CM (ELECTROSURGICAL) ×4 IMPLANT
PACK BASIN MINOR ARMC (MISCELLANEOUS) ×4 IMPLANT
PACK GYN LAPAROSCOPIC (MISCELLANEOUS) ×4 IMPLANT
PAD OB MATERNITY 4.3X12.25 (PERSONAL CARE ITEMS) ×4 IMPLANT
PAD PREP 24X41 OB/GYN DISP (PERSONAL CARE ITEMS) ×4 IMPLANT
SLEEVE ENDOPATH XCEL 5M (ENDOMECHANICALS) ×8 IMPLANT
SOLUTION ELECTROLUBE (MISCELLANEOUS) ×4 IMPLANT
SPONGE LAP 18X18 RF (DISPOSABLE) ×4 IMPLANT
SUT VIC AB 0 CT1 27 (SUTURE) ×10
SUT VIC AB 0 CT1 27XCR 8 STRN (SUTURE) ×10 IMPLANT
SUT VIC AB 0 CT1 36 (SUTURE) ×4 IMPLANT
SUT VIC AB 4-0 FS2 27 (SUTURE) ×4 IMPLANT
SYR 10ML LL (SYRINGE) ×4 IMPLANT
TAPE TRANSPORE STRL 2 31045 (GAUZE/BANDAGES/DRESSINGS) ×4 IMPLANT
TROCAR XCEL NON-BLD 5MMX100MML (ENDOMECHANICALS) ×4 IMPLANT
TUBING INSUF HEATED (TUBING) ×4 IMPLANT

## 2018-10-02 NOTE — Anesthesia Procedure Notes (Addendum)
Procedure Name: Intubation Performed by: Terique Kawabata, CRNA Pre-anesthesia Checklist: Patient identified, Patient being monitored, Timeout performed, Emergency Drugs available and Suction available Patient Re-evaluated:Patient Re-evaluated prior to induction Oxygen Delivery Method: Circle system utilized Preoxygenation: Pre-oxygenation with 100% oxygen Induction Type: IV induction Ventilation: Mask ventilation without difficulty and Oral airway inserted - appropriate to patient size Laryngoscope Size: Mac and 3 Grade View: Grade I Tube type: Oral Tube size: 7.0 mm Number of attempts: 1 Airway Equipment and Method: Stylet Placement Confirmation: ETT inserted through vocal cords under direct vision,  positive ETCO2 and breath sounds checked- equal and bilateral Secured at: 21 cm Tube secured with: Tape Dental Injury: Teeth and Oropharynx as per pre-operative assessment        

## 2018-10-02 NOTE — Anesthesia Post-op Follow-up Note (Signed)
Anesthesia QCDR form completed.        

## 2018-10-02 NOTE — Op Note (Signed)
OPERATIVE NOTE 10/02/2018 12:04 PM  PRE-OPERATIVE DIAGNOSIS:  1) INTRAMURAL FIBROIDS, SUBMUCOSAL FIBROIDS,DYSFUNCTIONAL UTERINE BLEEDING  POST-OPERATIVE DIAGNOSIS:  1)  Same with extensive PAD  OPERATION: Procedure(s) (LRB): LAPAROSCOPIC ASSISTED VAGINAL HYSTERECTOMY WITH LEFT SALPINGO OOPHORECTOMY (Left) CYSTOSCOPY     SURGEON(S): Surgeon(s) and Role:    Harlin Heys, MD - Primary    * Defrancesco, Alanda Slim, MD - Assisting No other capable assistant available for this surgery which requires an experienced, high level assistant.   ANESTHESIA: General  ESTIMATED BLOOD LOSS: 100 mL  OPERATIVE FINDINGS: Large uterine fibroids.  Omental adhesions to abdominal wall.  L ovarian adhesions to bowel.  Thickened CD scar. Cystoscopy revealed both ureters patent and functioning.  SPECIMEN:  ID Type Source Tests Collected by Time Destination  1 : uterus with cervix, left tube and ovary Tissue [facility] benign resection SURGICAL PATHOLOGY Harlin Heys, MD 39/01/91 3300     COMPLICATIONS: None  DRAINS: Foley to gravity  DISPOSITION: Stable to recovery room  DESCRIPTION OF PROCEDURE:      The patient was prepped and draped in the dorsolithotomy position and placed under general anesthesia. The bladder was emptied. The cervix was grasped with a multi-toothed tenaculum and a uterine manipulator was placed within the cervical os respecting the position and curvature of the uterus. After changing gloves we proceeded abdominally. A small infraumbilical incision was made and a 5 mm trocar port was placed within the abdominopelvic cavity. The opening pressure was less than 7 mmHg.  Approximately 3 and 1/2 L of carbon dioxide gas was instilled within the abdominal pelvic cavity. The laparoscope was placed and the pelvis and abdomen were carefully inspected. In the usual manner, under direct visualization right and left lower quadrant ports of 5 mm size were placed.  Extensive  anterior abdominal wall adhesions were noted to the omentum.  Systematically alternating ports we were able to use the camera and one port and the LigaSure and another port and gradually dissect through the abdominal wall adhesions until these were completely free.  Hemostasis was noted. We then turned our attention to the pelvis where a large uterus with fibroids was noted.  Of significant note the right adnexa was absent as expected.  A large right-sided subserosal uterine fibroid was encountered.  The left adnexa was encapsulated and adhesions involving omentum and large bowel appendices epiploica.  Because it was easily identified the utero-ovarian ligament was coagulated and divided freeing the ovary from the uterus.  We were then able to carefully identify the infundibulopelvic ligament divide the adhesions triply coagulate ligament and then divide the ligament freeing the ovary.  Hemostasis was noted.  The round ligament on the left was identified coagulated and divided and a bladder flap was created. The upper aspect of the broad ligament was clamped coagulated and divided. The uterine arteries were skeletonized, triply coagulated and divided. Careful inspection of all pedicles and the remainder of the pelvis was performed. Hemostasis was noted. We then proceeded vaginally. A weighted speculum was placed posteriorly. A multi-toothed tenaculum was used to grasp the cervix and the cervix was injected in a circumferential manner with a dilute Pitressin solution. An incision was made around the cervix and the vaginal mucosa was dissected off of the cervix. The posterior cul-de-sac was identified and entered and the weighted speculum was placed within this.  The left ovary was removed at this time.  The anterior cul-de-sac was identified and entered and a retractor was  placed and used to retract the bladder anteriorly keeping it out of the operative field. The uterosacral ligaments were clamped divided and  suture ligated. The cardinal ligaments were clamped divided and suture ligated.  At this time systematic excision of fibroids was performed until the remaining small pedicles were identified clamped divided and suture ligated.  This allowed delivery of the remaining specimen.   Angle sutures were placed in the manner. A culdoplasty was performed. The peritoneum was identified anteriorly and then incorporating the left upper pedicle left lower pedicle right lower pedicle right upper pedicle and anterior peritoneum a pursestring suture was placed exteriorizing all pedicles. Hemostasis of all pedicles was noted at this time. The vaginal mucosa was then closed with a running suture of Vicryl. We then proceeded back abdominally where the pelvis was irrigated and cleaned.  Hemostasis from all pedicles was noted. The lower quadrant ports were removed, hemostasis of the port sites was noted, and the incisions were closed in subcuticular manner. The laparoscope and trocar sleeve were removed from the infraumbilical incision, hemostasis was noted, and the incision was closed in a subcuticular manner. A long-acting anesthetic was employed in the skin incisions. Because of the extensive pelvic dissection employed a cystoscopy was performed using fluoroscene dye and both ureter jets were identified.  A Foley catheter was placed. The patient went to recovery room in stable condition. Clear urine was noted in the Foley at the conclusion of the procedure.  Finis Bud, M.D. 10/02/2018 12:04 PM

## 2018-10-02 NOTE — Interval H&P Note (Signed)
History and Physical Interval Note:  10/02/2018 7:29 AM  Jenny Griffin  has presented today for surgery, with the diagnosis of INTRAMURAL FIBROIDS, SUBMUCOSAL FIBROIDS,DYSFUNCTIONAL UTERINE BLEEDING  The various methods of treatment have been discussed with the patient and family. After consideration of risks, benefits and other options for treatment, the patient has consented to  Procedure(s): LAPAROSCOPIC ASSISTED VAGINAL HYSTERECTOMY WITH LEFT SALPINGO OOPHORECTOMY (Left) as a surgical intervention .  The patient's history has been reviewed, patient examined, no change in status, stable for surgery.  I have reviewed the patient's chart and labs.  Questions were answered to the patient's satisfaction.     Jeannie Fend

## 2018-10-02 NOTE — Progress Notes (Signed)
   10/02/18 0700  Clinical Encounter Type  Visited With Patient;Family (Husband)  Visit Type Initial;Spiritual support  Recommendations Follow-up, if needed.  Spiritual Encounters  Spiritual Needs Emotional;Prayer   Patient was nervous about surgery and the loss of control. Chaplain asked the patient to do deep breathing exercises, while offering a calm presence. Patient requested prayer. Chaplain prayed with patient and her husband. At the end of the encounter, Chaplain conveyed assessment to the patient's nurse.

## 2018-10-02 NOTE — Transfer of Care (Signed)
Immediate Anesthesia Transfer of Care Note  Patient: Jenny Griffin  Procedure(s) Performed: LAPAROSCOPIC ASSISTED VAGINAL HYSTERECTOMY WITH LEFT SALPINGO OOPHORECTOMY (Left ) CYSTOSCOPY  Patient Location: PACU  Anesthesia Type:General  Level of Consciousness: sedated  Airway & Oxygen Therapy: Patient Spontanous Breathing and Patient connected to face mask oxygen  Post-op Assessment: Report given to RN and Post -op Vital signs reviewed and stable  Post vital signs: Reviewed and stable  Last Vitals:  Vitals Value Taken Time  BP 125/84 10/02/2018 11:36 AM  Temp    Pulse 101 10/02/2018 11:36 AM  Resp 19 10/02/2018 11:36 AM  SpO2 100 % 10/02/2018 11:36 AM  Vitals shown include unvalidated device data.  Last Pain:  Vitals:   10/02/18 0604  TempSrc: Temporal  PainSc: 0-No pain         Complications: No apparent anesthesia complications

## 2018-10-02 NOTE — Anesthesia Preprocedure Evaluation (Signed)
Anesthesia Evaluation  Patient identified by MRN, date of birth, ID band Patient awake    Reviewed: Allergy & Precautions, H&P , NPO status , Patient's Chart, lab work & pertinent test results, reviewed documented beta blocker date and time   History of Anesthesia Complications (+) PONV and history of anesthetic complications  Airway Mallampati: II  TM Distance: >3 FB Neck ROM: full    Dental  (+) Teeth Intact   Pulmonary neg pulmonary ROS,    Pulmonary exam normal        Cardiovascular Exercise Tolerance: Good negative cardio ROS Normal cardiovascular exam Rhythm:regular Rate:Normal     Neuro/Psych  Headaches, Anxiety negative neurological ROS  negative psych ROS   GI/Hepatic negative GI ROS, Neg liver ROS, GERD  ,  Endo/Other  negative endocrine ROS  Renal/GU negative Renal ROS  negative genitourinary   Musculoskeletal   Abdominal   Peds  Hematology negative hematology ROS (+) Blood dyscrasia, anemia ,   Anesthesia Other Findings Past Medical History: No date: Anemia No date: Anxiety No date: Complication of anesthesia No date: GERD (gastroesophageal reflux disease)     Comment:  RARE-NO MEDS No date: Headache     Comment:  H/O MIGRAINES january 2013: Panic attacks No date: PONV (postoperative nausea and vomiting) Past Surgical History: No date: CESAREAN SECTION No date: RIGHT OOPHORECTOMY BMI    Body Mass Index:  32.97 kg/m     Reproductive/Obstetrics negative OB ROS                             Anesthesia Physical Anesthesia Plan  ASA: II  Anesthesia Plan: General ETT   Post-op Pain Management:    Induction:   PONV Risk Score and Plan:   Airway Management Planned:   Additional Equipment:   Intra-op Plan:   Post-operative Plan:   Informed Consent: I have reviewed the patients History and Physical, chart, labs and discussed the procedure including the risks,  benefits and alternatives for the proposed anesthesia with the patient or authorized representative who has indicated his/her understanding and acceptance.   Dental Advisory Given  Plan Discussed with: CRNA  Anesthesia Plan Comments:         Anesthesia Quick Evaluation

## 2018-10-02 NOTE — OR Nursing (Signed)
Dr Andree Elk reviewed hcg from 09/28/18, no order for urine pregnancy this am.

## 2018-10-03 ENCOUNTER — Other Ambulatory Visit: Payer: Self-pay

## 2018-10-03 DIAGNOSIS — D250 Submucous leiomyoma of uterus: Secondary | ICD-10-CM | POA: Diagnosis not present

## 2018-10-03 LAB — SURGICAL PATHOLOGY

## 2018-10-03 MED ORDER — ONDANSETRON 4 MG PO TBDP
4.0000 mg | ORAL_TABLET | Freq: Three times a day (TID) | ORAL | Status: DC | PRN
  Administered 2018-10-03: 4 mg via ORAL
  Filled 2018-10-03: qty 1

## 2018-10-03 MED ORDER — DIPHENHYDRAMINE HCL 25 MG PO CAPS
25.0000 mg | ORAL_CAPSULE | Freq: Four times a day (QID) | ORAL | Status: DC | PRN
  Administered 2018-10-03 (×2): 25 mg via ORAL
  Filled 2018-10-03 (×2): qty 1

## 2018-10-03 MED ORDER — ESTROGENS CONJUGATED 0.625 MG PO TABS: 0.6250 mg | ORAL_TABLET | Freq: Every day | ORAL | 0 refills | Status: DC

## 2018-10-03 MED ORDER — OXYCODONE-ACETAMINOPHEN 5-325 MG PO TABS: 1.0000 | ORAL_TABLET | ORAL | 0 refills | Status: DC | PRN

## 2018-10-03 NOTE — Discharge Summary (Signed)
     Discharge Summary  Admit date: 10/02/2018  Discharge Date and Time:10/03/2018  5:00 PM  Discharge to:  Home  Admission Diagnosis: Present on Admission: . Fibroids, submucosal                     Discharge  Diagnoses: Active Problems:   Fibroids, submucosal   OR Procedures:   Procedure(s): LAPAROSCOPIC ASSISTED VAGINAL HYSTERECTOMY WITH LEFT SALPINGO OOPHORECTOMY CYSTOSCOPY Date -------------------                              Discharge Day Progress Note:   Subjective:   The patient does not have complaints.  She is ambulating well. She is taking PO well. Her pain is well controlled with her current medications. She is urinating without difficulty and is passing flatus.   Objective:  BP 99/61 (BP Location: Right Arm)   Pulse 84   Temp 98 F (36.7 C) (Oral)   Resp 18   Ht 4' 11.5" (1.511 m)   Wt 75.3 kg   LMP 09/24/2018   SpO2 98%   BMI 32.97 kg/m     Abdomen:                         clean, dry, no drainage    Assessment:   Doing well.  Normal progress as expected.   Pt desires D/C  Plan:        Discharge home.                       Medications as directed.  Hospital Course:  No notes on file   Condition at Discharge:  good Discharge Medications:     Follow Up:   Follow-up Information    Harlin Heys, MD Follow up in 1 week(s).   Specialty:  Obstetrics and Gynecology Contact information: [address] [place] [address] [phone]           Finis Bud, M.D. 10/03/2018 5:00 PM

## 2018-10-03 NOTE — Progress Notes (Signed)
Pressure dressing removed from lower left incision site per Dr. Amalia Hailey request. Incision is clean, dry, and intact. Only a scant amount of old drainage is present on the adhesive strip that was under the pressure dressing.

## 2018-10-03 NOTE — Progress Notes (Signed)
Patient discharged home. Discharge instructions, prescriptions and follow up appointment given to and reviewed with patient. Patient verbalized understanding. Patient wheeled out by NT 

## 2018-10-03 NOTE — Plan of Care (Signed)
Vs stable; encouraged to move in bed; encouraged to use IS; taking toradol and dilaudid for pain control; pt did take PO percocet to "see how I do" with the PO percocet; has not needed anything for nausea/vomiting this shift; foley draining well; catheter to be removed prior to end of this shift; tolerating clear liquids and some crackers; may advance diet to regular for breakfast; left incision has pressure dressing and no drainage seen

## 2018-10-05 NOTE — Anesthesia Postprocedure Evaluation (Signed)
Anesthesia Post Note  Patient: Jenny Griffin  Procedure(s) Performed: LAPAROSCOPIC ASSISTED VAGINAL HYSTERECTOMY WITH LEFT SALPINGO OOPHORECTOMY (Left ) CYSTOSCOPY  Patient location during evaluation: PACU Anesthesia Type: General Level of consciousness: awake and alert Pain management: pain level controlled Vital Signs Assessment: post-procedure vital signs reviewed and stable Respiratory status: spontaneous breathing, nonlabored ventilation, respiratory function stable and patient connected to nasal cannula oxygen Cardiovascular status: blood pressure returned to baseline and stable Postop Assessment: no apparent nausea or vomiting Anesthetic complications: no     Last Vitals:  Vitals:   10/03/18 1206 10/03/18 1623  BP: (!) 84/47 99/61  Pulse: 70 84  Resp: 18 18  Temp: 36.9 C 36.7 C  SpO2: 98% 98%    Last Pain:  Vitals:   10/03/18 1724  TempSrc:   PainSc: [name]

## 2018-10-11 ENCOUNTER — Encounter: Payer: Self-pay | Admitting: Obstetrics and Gynecology

## 2018-10-11 ENCOUNTER — Ambulatory Visit (INDEPENDENT_AMBULATORY_CARE_PROVIDER_SITE_OTHER): Payer: 59 | Admitting: Obstetrics and Gynecology

## 2018-10-11 VITALS — BP 116/74 | HR 98 | Ht 59.5 in | Wt 160.0 lb

## 2018-10-11 DIAGNOSIS — Z9889 Other specified postprocedural states: Secondary | ICD-10-CM

## 2018-10-11 NOTE — Progress Notes (Signed)
Pt presents today for 1 week post-op. Pt notes soreness but says it is tolerable. Pt states she is only taking pain meds at night to help sleep. Pt says she is feeling good.

## 2018-10-11 NOTE — Progress Notes (Signed)
HPI:      [name] is a 47 y.o. G4P0003 who LMP was Patient's last menstrual period was 09/24/2018.  Subjective:   She presents today 1 week postop from hysterectomy.  She is taking her estrogen as directed.  Reports no hot flashes.  She is eating voiding and having bowel movements without issue.  Complains of occasional rectal pain but otherwise feels well.  Denies bleeding.    Hx: The following portions of the patient's history were reviewed and updated as appropriate:             She  has a past medical history of Anemia, Anxiety, Complication of anesthesia, GERD (gastroesophageal reflux disease), Headache, Panic attacks (january 2013), and PONV (postoperative nausea and vomiting). She does not have any pertinent problems on file. She  has a past surgical history that includes Cesarean section; Right oophorectomy; Laparoscopic vaginal hysterectomy with salpingo oophorectomy (Left, 10/02/2018); and Cystoscopy (10/02/2018). Her family history is not on file. She  reports that she has never smoked. She has never used smokeless tobacco. She reports that she does not drink alcohol or use drugs. She has a current medication list which includes the following prescription(s): estrogens (conjugated) and oxycodone-acetaminophen. She has No Known Allergies.       Review of Systems:  Review of Systems  Constitutional: Denied constitutional symptoms, night sweats, recent illness, fatigue, fever, insomnia and weight loss.  Eyes: Denied eye symptoms, eye pain, photophobia, vision change and visual disturbance.  Ears/Nose/Throat/Neck: Denied ear, nose, throat or neck symptoms, hearing loss, nasal discharge, sinus congestion and sore throat.  Cardiovascular: Denied cardiovascular symptoms, arrhythmia, chest pain/pressure, edema, exercise intolerance, orthopnea and palpitations.  Respiratory: Denied pulmonary symptoms, asthma, pleuritic pain, productive sputum, cough, dyspnea and wheezing.   Gastrointestinal: Denied, gastro-esophageal reflux, melena, nausea and vomiting.  Genitourinary: Denied genitourinary symptoms including symptomatic vaginal discharge, pelvic relaxation issues, and urinary complaints.  Musculoskeletal: Denied musculoskeletal symptoms, stiffness, swelling, muscle weakness and myalgia.  Dermatologic: Denied dermatology symptoms, rash and scar.  Neurologic: Denied neurology symptoms, dizziness, headache, neck pain and syncope.  Psychiatric: Denied psychiatric symptoms, anxiety and depression.  Endocrine: Denied endocrine symptoms including hot flashes and night sweats.   Meds:   Current Outpatient Medications on File Prior to Visit  Medication Sig Dispense Refill  . estrogens, conjugated, (PREMARIN) 0.625 MG tablet Take 1 tablet (0.625 mg total) by mouth daily. Take daily for 21 days then do not take for 7 days. 50 tablet 0  . oxyCODONE-acetaminophen (PERCOCET/ROXICET) 5-325 MG tablet Take 1 tablet by mouth every 4 (four) hours as needed for moderate pain or severe pain. 20 tablet 0   No current facility-administered medications on file prior to visit.     Objective:     Vitals:   10/11/18 0953  BP: 116/74  Pulse: 98               Abdomen: Soft.  Non-tender.  No masses.  No HSM.  Incision/s: Intact.  Healing well.  No erythema.  No drainage.      Assessment:    G4P0003 Patient Active Problem List   Diagnosis Date Noted  . Fibroids, submucosal 10/02/2018  . Chest pain 11/12/2017  . Nausea alone 05/02/2012  . Pernicious anemia 09/16/2011     1. Post-operative state     Patient with excellent recovery.   Plan:            1.  Patient is slowly resume normal activities with exception of heavy  lifting or tampons or intercourse.  2.  Continue estrogen Orders No orders of the defined types were placed in this encounter.   No orders of the defined types were placed in this encounter.     F/U  Return in about 5 weeks (around  11/15/2018).  Finis Bud, M.D. 10/11/2018 10:24 AM

## 2018-10-27 ENCOUNTER — Telehealth: Payer: Self-pay | Admitting: Obstetrics and Gynecology

## 2018-10-27 NOTE — Telephone Encounter (Signed)
Pt called complaining of extreme pain. Pt stated she has not been taking her pain medication and she has begun to urinate on herself. I advised pt to try taking her medication to ease the pain. If pt does not feel better, she claims she will go to the ER. Pt will call back next week and update [place] on her status.

## 2018-11-07 ENCOUNTER — Ambulatory Visit (INDEPENDENT_AMBULATORY_CARE_PROVIDER_SITE_OTHER): Payer: 59 | Admitting: Obstetrics and Gynecology

## 2018-11-07 ENCOUNTER — Encounter: Payer: Self-pay | Admitting: Obstetrics and Gynecology

## 2018-11-07 VITALS — BP 121/76 | HR 78 | Ht 59.5 in | Wt 162.9 lb

## 2018-11-07 DIAGNOSIS — R102 Pelvic and perineal pain: Secondary | ICD-10-CM

## 2018-11-07 DIAGNOSIS — Z9889 Other specified postprocedural states: Secondary | ICD-10-CM

## 2018-11-07 LAB — POCT URINALYSIS DIPSTICK
Bilirubin, UA: NEGATIVE
Glucose, UA: NEGATIVE
Ketones, UA: NEGATIVE
LEUKOCYTES UA: NEGATIVE
NITRITE UA: NEGATIVE
Protein, UA: NEGATIVE
RBC UA: NEGATIVE
SPEC GRAV UA: 1.01 (ref 1.010–1.025)
Urobilinogen, UA: 0.2 E.U./dL
pH, UA: 6 (ref 5.0–8.0)

## 2018-11-07 NOTE — Progress Notes (Signed)
HPI:      [name] is a 47 y.o. G4P0003 who LMP was Patient's last menstrual period was 09/24/2018.  Subjective:   She presents today 5 to 6 weeks postop.  She says that she is having "multiple issues".  She says she is still unable to wear pants because her abdominal wall is so tender.  Most of the pain is subumbilical.  She does state that wearing a bra makes her upper abdomen hurt.  She reports significant pain with urination especially at the completion of urination.  She complains of pain at her anus and this makes her sit sideways on chairs. She denies fevers.  She reports that her bowel movements have improved.  She is taking her estrogen as directed.    Hx: The following portions of the patient's history were reviewed and updated as appropriate:             She  has a past medical history of Anemia, Anxiety, Complication of anesthesia, GERD (gastroesophageal reflux disease), Headache, Panic attacks (january 2013), and PONV (postoperative nausea and vomiting). She does not have any pertinent problems on file. She  has a past surgical history that includes Cesarean section; Right oophorectomy; Laparoscopic vaginal hysterectomy with salpingo oophorectomy (Left, 10/02/2018); and Cystoscopy (10/02/2018). Her family history is not on file. She  reports that she has never smoked. She has never used smokeless tobacco. She reports that she does not drink alcohol or use drugs. She has a current medication list which includes the following prescription(s): estrogens (conjugated). She has No Known Allergies.       Review of Systems:  Review of Systems  Constitutional: Denied constitutional symptoms, night sweats, recent illness, fatigue, fever, insomnia and weight loss.  Eyes: Denied eye symptoms, eye pain, photophobia, vision change and visual disturbance.  Ears/Nose/Throat/Neck: Denied ear, nose, throat or neck symptoms, hearing loss, nasal discharge, sinus congestion and sore throat.   Cardiovascular: Denied cardiovascular symptoms, arrhythmia, chest pain/pressure, edema, exercise intolerance, orthopnea and palpitations.  Respiratory: Denied pulmonary symptoms, asthma, pleuritic pain, productive sputum, cough, dyspnea and wheezing.  Gastrointestinal: Denied, gastro-esophageal reflux, melena, nausea and vomiting.  Genitourinary: See HPI for additional information.  Musculoskeletal: Denied musculoskeletal symptoms, stiffness, swelling, muscle weakness and myalgia.  Dermatologic: Denied dermatology symptoms, rash and scar.  Neurologic: Denied neurology symptoms, dizziness, headache, neck pain and syncope.  Psychiatric: Denied psychiatric symptoms, anxiety and depression.  Endocrine: Denied endocrine symptoms including hot flashes and night sweats.   Meds:   Current Outpatient Medications on File Prior to Visit  Medication Sig Dispense Refill  . estrogens, conjugated, (PREMARIN) 0.625 MG tablet Take 1 tablet (0.625 mg total) by mouth daily. Take daily for 21 days then do not take for 7 days. 50 tablet 0   No current facility-administered medications on file prior to visit.     Objective:     Vitals:   11/07/18 0840  BP: 121/76  Pulse: 78               Abdomen: Soft.  Significant abdominal wall tenderness without deep palpation..  No masses.  No HSM.  Incision/s: Intact.  Healing well.  No erythema.  No drainage.    Pelvic:   Vulva: Normal appearance.  No lesions.  Vagina: No lesions or abnormalities noted.    Support: Normal pelvic support.  Urethra No masses tenderness or scarring.  Meatus Normal size without lesions or prolapse  Vag Cuff: Intact.  No lesions.Vaginal cuff healing well.  Anus:  Normal exam.  No lesions.  Perineum: Normal exam.  No lesions.        Bimanual   Adnexae: No masses.  Patient with significant pelvic tenderness-generalized but mostly midline  Cuff: Negative for abnormality.      Assessment:    G4P0003 Patient Active Problem  List   Diagnosis Date Noted  . Fibroids, submucosal 10/02/2018  . Chest pain 11/12/2017  . Nausea alone 05/02/2012  . Pernicious anemia 09/16/2011     1. Post-operative state   2. Pelvic pain in female     Symptoms consistent mostly with UTI.  However upper abdominal pain and rectal pain not really explained.  Significant abdominal pain with light touch also remains unexplained.   Plan:            1.  UA-C&S if necessary   2.  Pelvic ultrasound to rule out fluid collection or other abnormality near the vaginal cuff.    F/U  Return in about 2 weeks (around 11/21/2018).  Finis Bud, M.D. 11/07/2018 9:24 AM

## 2018-11-08 ENCOUNTER — Telehealth: Payer: Self-pay

## 2018-11-08 NOTE — Telephone Encounter (Signed)
Patient called and requested an updated return to work date be sent in to [place].  Updated date sent to [facility] and confirmation received.  Patient aware.

## 2018-11-09 ENCOUNTER — Other Ambulatory Visit: Payer: 59

## 2018-11-09 LAB — URINE CULTURE: Organism ID, Bacteria: NO GROWTH

## 2018-11-09 NOTE — Addendum Note (Signed)
Addended by: Finis Bud on: 11/09/2018 09:11 AM   Modules accepted: Orders

## 2018-11-13 ENCOUNTER — Ambulatory Visit (INDEPENDENT_AMBULATORY_CARE_PROVIDER_SITE_OTHER): Payer: 59

## 2018-11-13 DIAGNOSIS — Z9889 Other specified postprocedural states: Secondary | ICD-10-CM | POA: Diagnosis not present

## 2018-11-13 DIAGNOSIS — R102 Pelvic and perineal pain: Secondary | ICD-10-CM

## 2018-11-21 ENCOUNTER — Encounter: Payer: 59 | Admitting: Obstetrics and Gynecology

## 2018-11-29 ENCOUNTER — Ambulatory Visit (INDEPENDENT_AMBULATORY_CARE_PROVIDER_SITE_OTHER): Payer: 59 | Admitting: Obstetrics and Gynecology

## 2018-11-29 ENCOUNTER — Encounter: Payer: Self-pay | Admitting: Obstetrics and Gynecology

## 2018-11-29 VITALS — BP 122/80 | HR 78 | Ht 59.5 in | Wt 159.0 lb

## 2018-11-29 DIAGNOSIS — Z9889 Other specified postprocedural states: Secondary | ICD-10-CM

## 2018-11-29 MED ORDER — ESTROGENS CONJUGATED 0.625 MG PO TABS: 0.6250 mg | ORAL_TABLET | Freq: Every day | ORAL | 3 refills | Status: DC

## 2018-11-29 NOTE — Progress Notes (Signed)
HPI:      [name] is a 49 y.o. G4P0003 who LMP was Patient's last menstrual period was 09/24/2018.  Subjective:   She presents today 8 weeks postop.  She reports that the majority of her pain is gone.  She has resumed normal activities with exception of intercourse.  She is currently taking Premarin as directed and denies hot flashes.  She desires a return to work excuse today.    Hx: The following portions of the patient's history were reviewed and updated as appropriate:             She  has a past medical history of Anemia, Anxiety, Complication of anesthesia, GERD (gastroesophageal reflux disease), Headache, Panic attacks (january 2013), and PONV (postoperative nausea and vomiting). She does not have any pertinent problems on file. She  has a past surgical history that includes Cesarean section; Right oophorectomy; Laparoscopic vaginal hysterectomy with salpingo oophorectomy (Left, 10/02/2018); and Cystoscopy (10/02/2018). Her family history is not on file. She  reports that she has never smoked. She has never used smokeless tobacco. She reports that she does not drink alcohol or use drugs. She has a current medication list which includes the following prescription(s): estrogens (conjugated). She has No Known Allergies.       Review of Systems:  Review of Systems  Constitutional: Denied constitutional symptoms, night sweats, recent illness, fatigue, fever, insomnia and weight loss.  Eyes: Denied eye symptoms, eye pain, photophobia, vision change and visual disturbance.  Ears/Nose/Throat/Neck: Denied ear, nose, throat or neck symptoms, hearing loss, nasal discharge, sinus congestion and sore throat.  Cardiovascular: Denied cardiovascular symptoms, arrhythmia, chest pain/pressure, edema, exercise intolerance, orthopnea and palpitations.  Respiratory: Denied pulmonary symptoms, asthma, pleuritic pain, productive sputum, cough, dyspnea and wheezing.  Gastrointestinal: Denied,  gastro-esophageal reflux, melena, nausea and vomiting.  Genitourinary: Denied genitourinary symptoms including symptomatic vaginal discharge, pelvic relaxation issues, and urinary complaints.  Musculoskeletal: Denied musculoskeletal symptoms, stiffness, swelling, muscle weakness and myalgia.  Dermatologic: Denied dermatology symptoms, rash and scar.  Neurologic: Denied neurology symptoms, dizziness, headache, neck pain and syncope.  Psychiatric: Denied psychiatric symptoms, anxiety and depression.  Endocrine: Denied endocrine symptoms including hot flashes and night sweats.   Meds:   No current outpatient medications on file prior to visit.   No current facility-administered medications on file prior to visit.     Objective:     Vitals:   11/29/18 1116  BP: 122/80  Pulse: 78               Abdomen: Soft.  Non-tender.  No masses.  No HSM.  Incision/s: Intact.  Healing well.  No erythema.  No drainage.   Patient declined pelvic examination.   Assessment:    G4P0003 Patient Active Problem List   Diagnosis Date Noted  . Fibroids, submucosal 10/02/2018  . Chest pain 11/12/2017  . Nausea alone 05/02/2012  . Pernicious anemia 09/16/2011     1. Post-operative state     Patient doing much better.  Has resumed normal activities.  Patient states that she does not want to begin having intercourse yet and does not want "anything in her vagina yet".   Plan:            1.  Patient may resume normal activities with exception of heavy lifting. Patient may return to work.  2.  Continue Premarin daily Orders No orders of the defined types were placed in this encounter.    Meds ordered this encounter  Medications  .  estrogens, conjugated, (PREMARIN) 0.625 MG tablet    Sig: Take 1 tablet (0.625 mg total) by mouth daily. Take daily    Dispense:  90 tablet    Refill:  3      F/U  Return in about 3 months (around 02/28/2019).  Finis Bud, M.D. 11/29/2018 11:41 AM

## 2019-01-02 ENCOUNTER — Encounter: Payer: 59 | Admitting: Obstetrics and Gynecology

## 2019-01-09 ENCOUNTER — Encounter: Payer: 59 | Admitting: Obstetrics and Gynecology

## 2019-01-11 ENCOUNTER — Encounter: Payer: 59 | Admitting: Obstetrics and Gynecology

## 2019-01-25 ENCOUNTER — Encounter: Payer: 59 | Admitting: Obstetrics and Gynecology

## 2019-12-27 ENCOUNTER — Other Ambulatory Visit: Payer: Self-pay | Admitting: Obstetrics and Gynecology

## 2019-12-27 DIAGNOSIS — Z9889 Other specified postprocedural states: Secondary | ICD-10-CM

## 2020-06-27 ENCOUNTER — Other Ambulatory Visit: Payer: Self-pay

## 2020-06-27 ENCOUNTER — Emergency Department (HOSPITAL_COMMUNITY)
Admission: EM | Admit: 2020-06-27 | Discharge: 2020-06-27 | Disposition: A | Discharge status: Home / Self Care | Payer: BC Managed Care – PPO | Attending: Emergency Medicine | Admitting: Emergency Medicine

## 2020-06-27 ENCOUNTER — Emergency Department (HOSPITAL_COMMUNITY): Payer: BC Managed Care – PPO

## 2020-06-27 ENCOUNTER — Encounter (HOSPITAL_COMMUNITY): Payer: Self-pay | Admitting: Emergency Medicine

## 2020-06-27 DIAGNOSIS — R531 Weakness: Secondary | ICD-10-CM | POA: Insufficient documentation

## 2020-06-27 DIAGNOSIS — F449 Dissociative and conversion disorder, unspecified: Secondary | ICD-10-CM | POA: Diagnosis not present

## 2020-06-27 DIAGNOSIS — Z79899 Other long term (current) drug therapy: Secondary | ICD-10-CM | POA: Insufficient documentation

## 2020-06-27 DIAGNOSIS — I639 Cerebral infarction, unspecified: Secondary | ICD-10-CM | POA: Diagnosis present

## 2020-06-27 DIAGNOSIS — R519 Headache, unspecified: Secondary | ICD-10-CM | POA: Insufficient documentation

## 2020-06-27 DIAGNOSIS — R079 Chest pain, unspecified: Secondary | ICD-10-CM | POA: Diagnosis not present

## 2020-06-27 LAB — I-STAT CHEM 8, ED
BUN: 10 mg/dL (ref 6–20)
Calcium, Ion: 1.14 mmol/L — ABNORMAL LOW (ref 1.15–1.40)
Chloride: 106 mmol/L (ref 98–111)
Creatinine, Ser: 0.5 mg/dL (ref 0.44–1.00)
Glucose, Bld: 110 mg/dL — ABNORMAL HIGH (ref 70–99)
HCT: 37 % (ref 36.0–46.0)
Hemoglobin: 12.6 g/dL (ref 12.0–15.0)
Potassium: 3.5 mmol/L (ref 3.5–5.1)
Sodium: 142 mmol/L (ref 135–145)
TCO2: 22 mmol/L (ref 22–32)

## 2020-06-27 LAB — DIFFERENTIAL
Abs Immature Granulocytes: 0.02 10*3/uL (ref 0.00–0.07)
Basophils Absolute: 0.1 10*3/uL (ref 0.0–0.1)
Basophils Relative: 1 %
Eosinophils Absolute: 0.3 10*3/uL (ref 0.0–0.5)
Eosinophils Relative: 4 %
Immature Granulocytes: 0 %
Lymphocytes Relative: 31 %
Lymphs Abs: 1.9 10*3/uL (ref 0.7–4.0)
Monocytes Absolute: 0.5 10*3/uL (ref 0.1–1.0)
Monocytes Relative: 9 %
Neutro Abs: 3.3 10*3/uL (ref 1.7–7.7)
Neutrophils Relative %: 55 %

## 2020-06-27 LAB — CBC
HCT: 38.2 % (ref 36.0–46.0)
Hemoglobin: 12.4 g/dL (ref 12.0–15.0)
MCH: 27.6 pg (ref 26.0–34.0)
MCHC: 32.5 g/dL (ref 30.0–36.0)
MCV: 84.9 fL (ref 80.0–100.0)
Platelets: 250 10*3/uL (ref 150–400)
RBC: 4.5 MIL/uL (ref 3.87–5.11)
RDW: 12.3 % (ref 11.5–15.5)
WBC: 6.1 10*3/uL (ref 4.0–10.5)
nRBC: 0 % (ref 0.0–0.2)

## 2020-06-27 LAB — I-STAT BETA HCG BLOOD, ED (MC, WL, AP ONLY): I-stat hCG, quantitative: <5 m[IU]/mL (ref <5)

## 2020-06-27 LAB — COMPREHENSIVE METABOLIC PANEL
ALT: 15 U/L (ref 0–44)
AST: 13 U/L — ABNORMAL LOW (ref 15–41)
Albumin: 3.2 g/dL — ABNORMAL LOW (ref 3.5–5.0)
Alkaline Phosphatase: 64 U/L (ref 38–126)
Anion gap: 10 (ref 5–15)
BUN: 8 mg/dL (ref 6–20)
CO2: 23 mmol/L (ref 22–32)
Calcium: 8.9 mg/dL (ref 8.9–10.3)
Chloride: 106 mmol/L (ref 98–111)
Creatinine, Ser: 0.66 mg/dL (ref 0.44–1.00)
GFR calc Af Amer: >60 mL/min (ref >60)
GFR calc non Af Amer: >60 mL/min (ref >60)
Glucose, Bld: 113 mg/dL — ABNORMAL HIGH (ref 70–99)
Potassium: 3.6 mmol/L (ref 3.5–5.1)
Sodium: 139 mmol/L (ref 135–145)
Total Bilirubin: 0.2 mg/dL — ABNORMAL LOW (ref 0.3–1.2)
Total Protein: 7 g/dL (ref 6.5–8.1)

## 2020-06-27 LAB — PROTIME-INR
INR: 0.9 (ref 0.8–1.2)
Prothrombin Time: 12.2 seconds (ref 11.4–15.2)

## 2020-06-27 LAB — CBG MONITORING, ED: Glucose-Capillary: 108 mg/dL — ABNORMAL HIGH (ref 70–99)

## 2020-06-27 LAB — APTT: aPTT: 25 seconds (ref 24–36)

## 2020-06-27 LAB — ETHANOL: Alcohol, Ethyl (B): <10 mg/dL (ref <10)

## 2020-06-27 LAB — TROPONIN I (HIGH SENSITIVITY): Troponin I (High Sensitivity): 3 ng/L (ref <18)

## 2020-06-27 MED ORDER — LORAZEPAM 2 MG/ML IJ SOLN
1.0000 mg | Freq: Once | INTRAMUSCULAR | Status: AC
  Administered 2020-06-27: 1 mg via INTRAVENOUS
  Filled 2020-06-27: qty 1

## 2020-06-27 MED ORDER — DEXAMETHASONE SODIUM PHOSPHATE 10 MG/ML IJ SOLN
10.0000 mg | Freq: Once | INTRAMUSCULAR | Status: AC
  Administered 2020-06-27: 10 mg via INTRAVENOUS
  Filled 2020-06-27: qty 1

## 2020-06-27 MED ORDER — PROCHLORPERAZINE EDISYLATE 10 MG/2ML IJ SOLN
10.0000 mg | Freq: Once | INTRAMUSCULAR | Status: AC
  Administered 2020-06-27: 10 mg via INTRAVENOUS
  Filled 2020-06-27: qty 2

## 2020-06-27 MED ORDER — FENTANYL CITRATE (PF) 100 MCG/2ML IJ SOLN
50.0000 ug | Freq: Once | INTRAMUSCULAR | Status: AC
  Administered 2020-06-27: 50 ug via INTRAVENOUS
  Filled 2020-06-27: qty 2

## 2020-06-27 MED ORDER — DIPHENHYDRAMINE HCL 50 MG/ML IJ SOLN
25.0000 mg | Freq: Once | INTRAMUSCULAR | Status: AC
  Administered 2020-06-27: 25 mg via INTRAVENOUS
  Filled 2020-06-27: qty 1

## 2020-06-27 NOTE — ED Triage Notes (Addendum)
Pt arrives via [facility] from home with c/o numbness to L nose and around L eye that radiated down left arm. She reports symptoms began around 10 pm last night. Upon arrival, pt a/ox4, speech clear, droop to R face. Airway intact. Pt also endorses L sided sharp chest pain that radiates to her back. She reports cp awoke her from sleep around 4am. Pt also endorses dizziness x3-4 days.

## 2020-06-27 NOTE — ED Notes (Signed)
Pt returned from CT °

## 2020-06-27 NOTE — ED Provider Notes (Signed)
[place] EMERGENCY DEPARTMENT Provider Note   CSN: [id number] Arrival date & time: 06/27/20  0086     History Chief Complaint  Patient presents with  . Code Stroke    Jenny Griffin is a 49 y.o. female.   Neurologic Problem This is a new problem. The current episode started 1 to 2 hours ago. The problem occurs constantly. The problem has not changed since onset.Associated symptoms include chest pain and headaches. Nothing aggravates the symptoms. Nothing relieves the symptoms. She has tried nothing for the symptoms. The treatment provided no relief.       Past Medical History:  Diagnosis Date  . Anemia   . Anxiety   . Complication of anesthesia   . GERD (gastroesophageal reflux disease)    RARE-NO MEDS  . Headache    H/O MIGRAINES  . Panic attacks january 2013  . PONV (postoperative nausea and vomiting)     Patient Active Problem List   Diagnosis Date Noted  . Fibroids, submucosal 10/02/2018  . Chest pain 11/12/2017  . Nausea alone 05/02/2012  . Pernicious anemia 09/16/2011    Past Surgical History:  Procedure Laterality Date  . CESAREAN SECTION    . CYSTOSCOPY  10/02/2018   Procedure: CYSTOSCOPY;  Surgeon: Harlin Heys, MD;  Location: [facility] ORS;  Service: Gynecology;;  . LAPAROSCOPIC VAGINAL HYSTERECTOMY WITH SALPINGO OOPHORECTOMY Left 10/02/2018   Procedure: LAPAROSCOPIC ASSISTED VAGINAL HYSTERECTOMY WITH LEFT SALPINGO OOPHORECTOMY;  Surgeon: Harlin Heys, MD;  Location: [facility] ORS;  Service: Gynecology;  Laterality: Left;  . RIGHT OOPHORECTOMY       OB History    Gravida  4   Para  0   Term  0   Preterm  0   AB  0   Living  3     SAB  0   TAB  0   Ectopic  0   Multiple  0   Live Births  0           No family history on file.  Social History   Tobacco Use  . Smoking status: Never Smoker  . Smokeless tobacco: Never Used  Vaping Use  . Vaping Use: Never assessed  Substance Use Topics  . Alcohol  use: No  . Drug use: No    Home Medications Prior to Admission medications   Medication Sig Start Date End Date Taking? Authorizing Provider  Cyanocobalamin (NASCOBAL) 500 MCG/0.1ML SOLN Place 500 mcg into the nose every Friday.  12/24/19  Yes [provider]  PREMARIN 0.625 MG tablet TAKE 1 TABLET BY MOUTH DAILY Patient taking differently: Take 0.625 mg by mouth at bedtime.  12/30/19  Yes Harlin Heys, MD    Allergies    Contrast media [iodinated diagnostic agents]  Review of Systems   Review of Systems  Cardiovascular: Positive for chest pain.  Neurological: Positive for headaches.  All other systems reviewed and are negative.   Physical Exam Updated Vital Signs BP 103/64   Pulse 79   Temp 98.1 F (36.7 C) (Oral)   Resp 11   Wt 80.3 kg   LMP 09/24/2018   SpO2 94%   BMI 35.16 kg/m   Physical Exam Vitals and nursing note reviewed.  Constitutional:      Appearance: She is well-developed.  HENT:     Head: Normocephalic and atraumatic.     Mouth/Throat:     Mouth: Mucous membranes are moist.     Pharynx: Oropharynx is clear.  Eyes:     Pupils: Pupils are equal, round, and reactive to light.  Cardiovascular:     Rate and Rhythm: Normal rate and regular rhythm.  Pulmonary:     Effort: No respiratory distress.     Breath sounds: No stridor.  Abdominal:     General: There is no distension.  Musculoskeletal:        General: No swelling or tenderness. Normal range of motion.     Cervical back: Normal range of motion.  Skin:    General: Skin is warm and dry.  Neurological:     General: No focal deficit present.     Mental Status: She is alert.     ED Results / Procedures / Treatments   Labs (all labs ordered are listed, but only abnormal results are displayed) Labs Reviewed  COMPREHENSIVE METABOLIC PANEL - Abnormal; Notable for the following components:      Result Value   Glucose, Bld 113 (*)    Albumin 3.2 (*)    AST 13 (*)    Total  Bilirubin 0.2 (*)    All other components within normal limits  CBG MONITORING, ED - Abnormal; Notable for the following components:   Glucose-Capillary 108 (*)    All other components within normal limits  I-STAT CHEM 8, ED - Abnormal; Notable for the following components:   Glucose, Bld 110 (*)    Calcium, Ion 1.14 (*)    All other components within normal limits  ETHANOL  PROTIME-INR  APTT  CBC  DIFFERENTIAL  I-STAT BETA HCG BLOOD, ED ([facility], [facility], [facility] ONLY)  TROPONIN I (HIGH SENSITIVITY)  TROPONIN I (HIGH SENSITIVITY)    EKG EKG Interpretation  Date/Time:  Friday June 27 2020 05:49:19 EDT Ventricular Rate:  81 PR Interval:    QRS Duration: 92 QT Interval:  371 QTC Calculation: 431 R Axis:   35 Text Interpretation: Sinus rhythm no acute ST/T changes Confirmed by Sherwood Gambler [phone]) on 06/27/2020 7:17:12 AM   Radiology MR ANGIO HEAD WO CONTRAST  Result Date: 06/27/2020 CLINICAL DATA:  49 year old female code stroke presentation this morning. Left facial droop. EXAM: MRI HEAD WITHOUT CONTRAST MRA HEAD WITHOUT CONTRAST MRA NECK WITHOUT CONTRAST TECHNIQUE: Multiplanar, multiecho pulse sequences of the brain and surrounding structures were obtained without intravenous contrast. Angiographic images of the Circle of Willis were obtained using MRA technique without intravenous contrast. Angiographic images of the neck were obtained using MRA technique without intravenous contrast. Carotid stenosis measurements (when applicable) are obtained utilizing NASCET criteria, using the distal internal carotid diameter as the denominator. COMPARISON:  Head CT without contrast 0538 hours. FINDINGS: MRI HEAD FINDINGS Brain: No restricted diffusion to suggest acute infarction. No midline shift, mass effect, evidence of mass lesion, ventriculomegaly, extra-axial collection or acute intracranial hemorrhage. Cervicomedullary junction and pituitary are within normal limits. Normal cerebral volume. [place] Cables and  white matter signal is within normal limits throughout the brain. No definite encephalomalacia or chronic cerebral blood products identified Vascular: Major intracranial vascular flow voids are preserved. Skull and upper cervical spine: Negative visible cervical spine. Visualized bone marrow signal is within normal limits. Sinuses/Orbits: Negative orbits. Scattered mild paranasal sinus mucosal thickening. Hypoplastic frontal sinuses. No sinus fluid levels. Other: Visible internal auditory structures appear normal. Mastoids are clear. Normal stylomastoid foramina. Bilateral parotid glands, as well as other visible face and scalp soft tissues appear within normal limits. MRA NECK FINDINGS Time-of-flight imaging demonstrates antegrade flow signal in the bilateral cervical carotid and vertebral arteries. Both carotid  bifurcations appear normal. The right vertebral artery appears somewhat dominant. Antegrade flow signal is present to the skull base. The vertebrals appear within normal limits. MRA HEAD FINDINGS Antegrade flow in the posterior circulation with dominant distal right vertebral artery. No distal vertebral stenosis, the left vertebral remains patent to the vertebrobasilar junction. Patent basilar artery without stenosis. Patent AICA, SCA and PCA origins. Fetal type left PCA. The right posterior communicating artery is also present. Bilateral PCA branches are within normal limits. Antegrade flow in both ICA siphons. No convincing siphon stenosis. Posterior communicating artery origins are within normal limits. Patent carotid termini. Normal MCA and ACA origins. Diminutive or absent anterior communicating artery. Visible ACA branches are within normal limits. Bilateral MCA M1 segments and MCA bi/trifurcations are patent. Visible bilateral MCA branches are within normal limits. IMPRESSION: 1. No acute intracranial abnormality. 2. Normal noncontrast MRI appearance of the brain. 3. MRAs of the head and neck are  unremarkable. Electronically Signed   By: Genevie Ann M.D.   On: 06/27/2020 08:56   MR ANGIO NECK WO CONTRAST  Result Date: 06/27/2020 CLINICAL DATA:  49 year old female code stroke presentation this morning. Left facial droop. EXAM: MRI HEAD WITHOUT CONTRAST MRA HEAD WITHOUT CONTRAST MRA NECK WITHOUT CONTRAST TECHNIQUE: Multiplanar, multiecho pulse sequences of the brain and surrounding structures were obtained without intravenous contrast. Angiographic images of the Circle of Willis were obtained using MRA technique without intravenous contrast. Angiographic images of the neck were obtained using MRA technique without intravenous contrast. Carotid stenosis measurements (when applicable) are obtained utilizing NASCET criteria, using the distal internal carotid diameter as the denominator. COMPARISON:  Head CT without contrast 0538 hours. FINDINGS: MRI HEAD FINDINGS Brain: No restricted diffusion to suggest acute infarction. No midline shift, mass effect, evidence of mass lesion, ventriculomegaly, extra-axial collection or acute intracranial hemorrhage. Cervicomedullary junction and pituitary are within normal limits. Normal cerebral volume. [place] Cables and white matter signal is within normal limits throughout the brain. No definite encephalomalacia or chronic cerebral blood products identified Vascular: Major intracranial vascular flow voids are preserved. Skull and upper cervical spine: Negative visible cervical spine. Visualized bone marrow signal is within normal limits. Sinuses/Orbits: Negative orbits. Scattered mild paranasal sinus mucosal thickening. Hypoplastic frontal sinuses. No sinus fluid levels. Other: Visible internal auditory structures appear normal. Mastoids are clear. Normal stylomastoid foramina. Bilateral parotid glands, as well as other visible face and scalp soft tissues appear within normal limits. MRA NECK FINDINGS Time-of-flight imaging demonstrates antegrade flow signal in the bilateral cervical  carotid and vertebral arteries. Both carotid bifurcations appear normal. The right vertebral artery appears somewhat dominant. Antegrade flow signal is present to the skull base. The vertebrals appear within normal limits. MRA HEAD FINDINGS Antegrade flow in the posterior circulation with dominant distal right vertebral artery. No distal vertebral stenosis, the left vertebral remains patent to the vertebrobasilar junction. Patent basilar artery without stenosis. Patent AICA, SCA and PCA origins. Fetal type left PCA. The right posterior communicating artery is also present. Bilateral PCA branches are within normal limits. Antegrade flow in both ICA siphons. No convincing siphon stenosis. Posterior communicating artery origins are within normal limits. Patent carotid termini. Normal MCA and ACA origins. Diminutive or absent anterior communicating artery. Visible ACA branches are within normal limits. Bilateral MCA M1 segments and MCA bi/trifurcations are patent. Visible bilateral MCA branches are within normal limits. IMPRESSION: 1. No acute intracranial abnormality. 2. Normal noncontrast MRI appearance of the brain. 3. MRAs of the head and neck are unremarkable. Electronically Signed  By: Genevie Ann M.D.   On: 06/27/2020 08:56   MR BRAIN WO CONTRAST  Result Date: 06/27/2020 CLINICAL DATA:  49 year old female code stroke presentation this morning. Left facial droop. EXAM: MRI HEAD WITHOUT CONTRAST MRA HEAD WITHOUT CONTRAST MRA NECK WITHOUT CONTRAST TECHNIQUE: Multiplanar, multiecho pulse sequences of the brain and surrounding structures were obtained without intravenous contrast. Angiographic images of the Circle of Willis were obtained using MRA technique without intravenous contrast. Angiographic images of the neck were obtained using MRA technique without intravenous contrast. Carotid stenosis measurements (when applicable) are obtained utilizing NASCET criteria, using the distal internal carotid diameter as the  denominator. COMPARISON:  Head CT without contrast 0538 hours. FINDINGS: MRI HEAD FINDINGS Brain: No restricted diffusion to suggest acute infarction. No midline shift, mass effect, evidence of mass lesion, ventriculomegaly, extra-axial collection or acute intracranial hemorrhage. Cervicomedullary junction and pituitary are within normal limits. Normal cerebral volume. [place] Cables and white matter signal is within normal limits throughout the brain. No definite encephalomalacia or chronic cerebral blood products identified Vascular: Major intracranial vascular flow voids are preserved. Skull and upper cervical spine: Negative visible cervical spine. Visualized bone marrow signal is within normal limits. Sinuses/Orbits: Negative orbits. Scattered mild paranasal sinus mucosal thickening. Hypoplastic frontal sinuses. No sinus fluid levels. Other: Visible internal auditory structures appear normal. Mastoids are clear. Normal stylomastoid foramina. Bilateral parotid glands, as well as other visible face and scalp soft tissues appear within normal limits. MRA NECK FINDINGS Time-of-flight imaging demonstrates antegrade flow signal in the bilateral cervical carotid and vertebral arteries. Both carotid bifurcations appear normal. The right vertebral artery appears somewhat dominant. Antegrade flow signal is present to the skull base. The vertebrals appear within normal limits. MRA HEAD FINDINGS Antegrade flow in the posterior circulation with dominant distal right vertebral artery. No distal vertebral stenosis, the left vertebral remains patent to the vertebrobasilar junction. Patent basilar artery without stenosis. Patent AICA, SCA and PCA origins. Fetal type left PCA. The right posterior communicating artery is also present. Bilateral PCA branches are within normal limits. Antegrade flow in both ICA siphons. No convincing siphon stenosis. Posterior communicating artery origins are within normal limits. Patent carotid termini.  Normal MCA and ACA origins. Diminutive or absent anterior communicating artery. Visible ACA branches are within normal limits. Bilateral MCA M1 segments and MCA bi/trifurcations are patent. Visible bilateral MCA branches are within normal limits. IMPRESSION: 1. No acute intracranial abnormality. 2. Normal noncontrast MRI appearance of the brain. 3. MRAs of the head and neck are unremarkable. Electronically Signed   By: Genevie Ann M.D.   On: 06/27/2020 08:56   CT HEAD CODE STROKE WO CONTRAST  Result Date: 06/27/2020 CLINICAL DATA:  Code stroke.  Left facial droop. EXAM: CT HEAD WITHOUT CONTRAST TECHNIQUE: Contiguous axial images were obtained from the base of the skull through the vertex without intravenous contrast. COMPARISON:  08/20/2012 FINDINGS: Brain: No evidence of acute infarction, hemorrhage, hydrocephalus, extra-axial collection or mass lesion/mass effect. Vascular: No hyperdense vessel or unexpected calcification. Skull: Normal. Negative for fracture or focal lesion. Sinuses/Orbits: No acute finding. Other: These results were communicated to Dr. Lorraine Lax at [place] 8/6/2021by text page via the [facility] messaging system. ASPECTS [facility] Stroke Program Early CT Score) - Ganglionic level infarction (caudate, lentiform nuclei, internal capsule, insula, M1-M3 cortex): 7 - Supraganglionic infarction (M4-M6 cortex): 3 Total score (0-10 with 10 being normal): 10 IMPRESSION: Negative head CT. Electronically Signed   By: Monte Fantasia M.D.   On: 06/27/2020 05:43  Procedures Procedures (including critical care time)  Medications Ordered in ED Medications  fentaNYL (SUBLIMAZE) injection 50 mcg (50 mcg Intravenous Given 06/27/20 0648)  LORazepam (ATIVAN) injection 1 mg (1 mg Intravenous Given 06/27/20 0702)  prochlorperazine (COMPAZINE) injection 10 mg (10 mg Intravenous Given 06/27/20 0649)  dexamethasone (DECADRON) injection 10 mg (10 mg Intravenous Given 06/27/20 0649)  diphenhydrAMINE (BENADRYL) injection 25  mg (25 mg Intravenous Given 06/27/20 5732)    ED Course  I have reviewed the triage vital signs and the nursing notes.  Pertinent labs & imaging results that were available during my care of the patient were reviewed by me and considered in my medical decision making (see chart for details).    MDM Rules/Calculators/A&P                          Arrived as a code stroke.  Secondary to numbness, headache and chest pain.  Seen very inconsistent with actual localized lesion but pending MRI to further evaluate.  Could be a complicated migraine so given a headache cocktail help with that.  Low likelihood of dissection if her MRI MRAs are negative.  Her troponin looks good here EKG looks good.  Care transferred to Dr. Ardith Dark pending finishing of her MRIs and reevaluation of her symptoms after headache cocktail.  Final Clinical Impression(s) / ED Diagnoses Final diagnoses:  Left-sided weakness  Nonspecific chest pain    Rx / DC Orders ED Discharge Orders    None       Dany Harten, Corene Cornea, MD 06/28/20 [phone]

## 2020-06-27 NOTE — ED Notes (Signed)
Patient transported to MRI 

## 2020-06-27 NOTE — Discharge Instructions (Signed)
If you develop recurrent, continued, or worsening chest pain, shortness of breath, headache, weakness, fever, vomiting, abdominal or back pain, or any other new/concerning symptoms then return to the ER for evaluation.

## 2020-06-27 NOTE — ED Provider Notes (Signed)
Care transferred to me.  Patient's MRI is negative.  MRA also negative.  Patient also had some chest pain that started at the same time as her facial and arm symptoms.  Dissection seems unlikely in this presentation.  ECG is unremarkable.  Troponin is negative.  Given the time of onset, I discussed getting a second troponin but she declines and wants to go home.  I have a pretty low suspicion for ACS.  I think is reasonable to send her home but we discussed return precautions.  Follow-up with PCP.   Sherwood Gambler, MD 06/27/20 [phone]

## 2020-06-27 NOTE — Consult Note (Signed)
Requesting Physician: Dr. Dayna Barker    Chief Complaint: Headache, chest pain, left-sided numbness  History obtained from: Patient and Chart     HPI:                                                                                                                                       Jenny Griffin is a 49 y.o. female with past medical history of migraines, panic attacks, pernicious anemia, anxiety presents to the emergency department as a code stroke for left-sided facial droop, chest pain, left-sided numbness.  Last known normal was 10 PM and patient had some numbness over her nose just before going to bed.  She woke up at 4.15 am with a sudden headache, unilateral, sharp affecting the left side.  She also noticed the left side of her face as well as arm was numb and also noticed she had a stabbing chest pain rating it about a 6 out of 10.  She called EMS who noticed patient had a left facial droop and left-sided drift and brought her in as a code stroke.  Blood pressure was 170 systolic.  On arrival to [facility], initial NIHSS obtained was 3 (facial droop, arm drift left-sided numbness).  It was noted however facial droop disappeared and patient spoke.  Stat CT head obtained showed no acute findings and patient taken to stat MRI to evaluate for acute stroke. Of note patient has a history of left carotid bruit.  Date last known well: 8.521 Time last known well: 10 PM tPA Given: no, outside window NIHSS: 3 Baseline MRS 0   Past Medical History:  Diagnosis Date  . Anemia   . Anxiety   . Complication of anesthesia   . GERD (gastroesophageal reflux disease)    RARE-NO MEDS  . Headache    H/O MIGRAINES  . Panic attacks january 2013  . PONV (postoperative nausea and vomiting)     Past Surgical History:  Procedure Laterality Date  . CESAREAN SECTION    . CYSTOSCOPY  10/02/2018   Procedure: CYSTOSCOPY;  Surgeon: Harlin Heys, MD;  Location: [facility] ORS;  Service: Gynecology;;  .  LAPAROSCOPIC VAGINAL HYSTERECTOMY WITH SALPINGO OOPHORECTOMY Left 10/02/2018   Procedure: LAPAROSCOPIC ASSISTED VAGINAL HYSTERECTOMY WITH LEFT SALPINGO OOPHORECTOMY;  Surgeon: Harlin Heys, MD;  Location: [facility] ORS;  Service: Gynecology;  Laterality: Left;  . RIGHT OOPHORECTOMY      No family history on file. Social History:  reports that she has never smoked. She has never used smokeless tobacco. She reports that she does not drink alcohol and does not use drugs.  Allergies:  Allergies  Allergen Reactions  . Contrast Media [Iodinated Diagnostic Agents] Other (See Comments)    Severe abdominal pain    Medications:  I reviewed home medications   ROS:                                                                                                                                     14 systems reviewed and negative except above   Examination:                                                                                                      General: Appears well-developed . Psych: Affect appropriate to situation Eyes: No scleral injection HENT: No OP obstrucion Head: Normocephalic.  Cardiovascular: Normal rate and regular rhythm. Respiratory: Effort normal and breath sounds normal to anterior ascultation GI: Soft.  No distension. There is no tenderness.  Skin: WDI    Neurological Examination Mental Status: Alert, oriented, thought content appropriate.  Speech fluent without evidence of aphasia. Able to follow 3 step commands without difficulty. Cranial Nerves: II: Visual fields grossly normal,  III,IV, VI: ptosis not present, extra-ocular motions intact bilaterally, pupils equal, round, reactive to light and accommodation V,VII: smile symmetric, facial light touch sensation normal bilaterally VIII: hearing normal bilaterally IX,X: uvula rises  symmetrically XI: bilateral shoulder shrug XII: midline tongue extension Motor: Right : Upper extremity   5/5    Left:     Upper extremity   5/5  Lower extremity   5/5     Lower extremity   5/5 Tone and bulk:normal tone throughout; no atrophy noted Sensory: Pinprick and light touch intact throughout, bilaterally Deep Tendon Reflexes: 2+ and symmetric throughout Plantars: Right: downgoing   Left: downgoing Cerebellar: normal finger-to-nose, normal rapid alternating movements and normal heel-to-shin test Gait: normal gait and station     Lab Results: Basic Metabolic Panel: Recent Labs  Lab 06/27/20 0538  NA 142  K 3.5  CL 106  GLUCOSE 110*  BUN 10  CREATININE 0.50    CBC: Recent Labs  Lab 06/27/20 0530 06/27/20 0538  WBC 6.1  --   NEUTROABS 3.3  --   HGB 12.4 12.6  HCT 38.2 37.0  MCV 84.9  --   PLT 250  --     Coagulation Studies: Recent Labs    06/27/20 0530  LABPROT 12.2  INR 0.9    Imaging: CT HEAD CODE STROKE WO CONTRAST  Result Date: 06/27/2020 CLINICAL DATA:  Code stroke.  Left facial droop. EXAM: CT HEAD WITHOUT CONTRAST TECHNIQUE: Contiguous axial images were obtained from the base of the skull through the vertex without intravenous contrast. COMPARISON:  08/20/2012 FINDINGS: Brain: No evidence of acute infarction, hemorrhage, hydrocephalus, extra-axial collection or mass lesion/mass effect. Vascular: No hyperdense vessel or unexpected calcification. Skull: Normal. Negative for fracture or focal lesion. Sinuses/Orbits: No acute finding. Other: These results were communicated to Dr. Lorraine Lax at [place] 8/6/2021by text page via the [facility] messaging system. ASPECTS [facility] Stroke Program Early CT Score) - Ganglionic level infarction (caudate, lentiform nuclei, internal capsule, insula, M1-M3 cortex): 7 - Supraganglionic infarction (M4-M6 cortex): 3 Total score (0-10 with 10 being normal): 10 IMPRESSION: Negative head CT. Electronically Signed   By: Monte Fantasia M.D.   On: 06/27/2020 05:43     ASSESSMENT AND PLAN   49 y.o. female with past medical history of migraines, panic attacks, pernicious anemia, anxiety presents to the emergency department as a code stroke for left-sided facial droop, chest pain, left-sided numbness. Exam inconsistent-facial droop disappeared when patient spoke or when she was distracted.  Arm drift was sustained downward drift with poor effort on handgrip.  Suspicion for conversion disorder versus migraine-less likely stroke.  Will obtain stat MRI brain to rule out acute stroke as well as MRA of the head and neck to rule out dissection given history of carotid bruit? however carotid bruit would not cause left-sided symptoms.   Recommendations Stat MRI brain and MRA of the head and neck-if negative consider migraine cocktail. Chest pain work-up per ED  Neurology will be available as needed.  Please call back neurology if MRI brain is positive for acute stroke     Jestine Bicknell [facility] Pager Number [phone]

## 2020-07-07 ENCOUNTER — Other Ambulatory Visit: Payer: Self-pay

## 2020-07-07 ENCOUNTER — Inpatient Hospital Stay: Payer: BC Managed Care – PPO | Attending: Oncology | Admitting: Oncology

## 2020-07-07 ENCOUNTER — Inpatient Hospital Stay: Payer: BC Managed Care – PPO

## 2020-07-07 ENCOUNTER — Encounter: Payer: Self-pay | Admitting: Oncology

## 2020-07-07 VITALS — BP 114/75 | HR 80 | Temp 96.9°F | Resp 18 | Wt 175.5 lb

## 2020-07-07 DIAGNOSIS — E538 Deficiency of other specified B group vitamins: Secondary | ICD-10-CM

## 2020-07-07 DIAGNOSIS — R5381 Other malaise: Secondary | ICD-10-CM | POA: Diagnosis not present

## 2020-07-07 DIAGNOSIS — R5383 Other fatigue: Secondary | ICD-10-CM | POA: Insufficient documentation

## 2020-07-07 DIAGNOSIS — G629 Polyneuropathy, unspecified: Secondary | ICD-10-CM | POA: Insufficient documentation

## 2020-07-07 LAB — CBC WITH DIFFERENTIAL/PLATELET
Abs Immature Granulocytes: 0.04 10*3/uL (ref 0.00–0.07)
Basophils Absolute: 0.1 10*3/uL (ref 0.0–0.1)
Basophils Relative: 1 %
Eosinophils Absolute: 0.2 10*3/uL (ref 0.0–0.5)
Eosinophils Relative: 2 %
HCT: 37.3 % (ref 36.0–46.0)
Hemoglobin: 12.7 g/dL (ref 12.0–15.0)
Immature Granulocytes: 1 %
Lymphocytes Relative: 27 %
Lymphs Abs: 1.8 10*3/uL (ref 0.7–4.0)
MCH: 28 pg (ref 26.0–34.0)
MCHC: 34 g/dL (ref 30.0–36.0)
MCV: 82.3 fL (ref 80.0–100.0)
Monocytes Absolute: 0.4 10*3/uL (ref 0.1–1.0)
Monocytes Relative: 6 %
Neutro Abs: 4.1 10*3/uL (ref 1.7–7.7)
Neutrophils Relative %: 63 %
Platelets: 293 10*3/uL (ref 150–400)
RBC: 4.53 MIL/uL (ref 3.87–5.11)
RDW: 12.2 % (ref 11.5–15.5)
WBC: 6.5 10*3/uL (ref 4.0–10.5)
nRBC: 0 % (ref 0.0–0.2)

## 2020-07-07 LAB — FOLATE: Folate: 13.9 ng/mL (ref >5.9)

## 2020-07-07 LAB — COMPREHENSIVE METABOLIC PANEL
ALT: 15 U/L (ref 0–44)
AST: 12 U/L — ABNORMAL LOW (ref 15–41)
Albumin: 3.8 g/dL (ref 3.5–5.0)
Alkaline Phosphatase: 69 U/L (ref 38–126)
Anion gap: 7 (ref 5–15)
BUN: 10 mg/dL (ref 6–20)
CO2: 27 mmol/L (ref 22–32)
Calcium: 8.8 mg/dL — ABNORMAL LOW (ref 8.9–10.3)
Chloride: 105 mmol/L (ref 98–111)
Creatinine, Ser: 0.62 mg/dL (ref 0.44–1.00)
GFR calc Af Amer: >60 mL/min (ref >60)
GFR calc non Af Amer: >60 mL/min (ref >60)
Glucose, Bld: 98 mg/dL (ref 70–99)
Potassium: 3.9 mmol/L (ref 3.5–5.1)
Sodium: 139 mmol/L (ref 135–145)
Total Bilirubin: 0.4 mg/dL (ref 0.3–1.2)
Total Protein: 8.2 g/dL — ABNORMAL HIGH (ref 6.5–8.1)

## 2020-07-07 LAB — FERRITIN: Ferritin: 9 ng/mL — ABNORMAL LOW (ref 11–307)

## 2020-07-07 LAB — VITAMIN B12: Vitamin B-12: 350 pg/mL (ref 180–914)

## 2020-07-07 LAB — RETICULOCYTES
Immature Retic Fract: 6.4 % (ref 2.3–15.9)
RBC.: 4.53 MIL/uL (ref 3.87–5.11)
Retic Count, Absolute: 95.6 10*3/uL (ref 19.0–186.0)
Retic Ct Pct: 2.1 % (ref 0.4–3.1)

## 2020-07-07 LAB — IRON AND TIBC
Iron: 34 ug/dL (ref 28–170)
Saturation Ratios: 7 % — ABNORMAL LOW (ref 10.4–31.8)
TIBC: 463 ug/dL — ABNORMAL HIGH (ref 250–450)
UIBC: 429 ug/dL

## 2020-07-07 LAB — TSH: TSH: 3.314 u[IU]/mL (ref 0.350–4.500)

## 2020-07-07 NOTE — Progress Notes (Signed)
Hematology/Oncology Consult note [facility] Telephone:([phone] [phone]  Patient Care Team: [name], MD as PCP - General (Family Medicine)   Name of the patient: Jenny Griffin  [id number]  [birth year]    Reason for referral-B12 deficiency   Referring physician-Dr. Kary Kos  Date of visit: 07/07/20   History of presenting illness-  patient is a 49 year old female with a past medical history significant for anxiety, GERD and pernicious anemia.  She was previously seen at the [facility] 2017 by Dr. Lindi Adie.  She has been using B12 nasal spray once every week.  However patient reports she has been having symptoms of worsening depression shortness of breath as well as cognitive difficulties last B12 level checked in February 2020 was 672.Most recent CBC from 06/27/2020 showed white count of 6.1, H&H of 12.4/38.2 with an MCV of 84 and a platelet count of 250.  Patient reports that currently she has difficulty in word finding at times and something else comes out of her mouth when she is meant something completely different.  Appetite and weight is currently stable.  She continues to be active and independent of her ADLs and IADLs.  Reports tingling numbness in her bilateral hands and occasionally pain in her bilateral feet.  Neuropathy sometimes causes her to lose grip of objects  ECOG PS- 0  Pain scale- 0   Review of systems- Review of Systems  Constitutional: Positive for malaise/fatigue. Negative for chills, fever and weight loss.  HENT: Negative for congestion, ear discharge and nosebleeds.   Eyes: Negative for blurred vision.  Respiratory: Negative for cough, hemoptysis, sputum production, shortness of breath and wheezing.   Cardiovascular: Negative for chest pain, palpitations, orthopnea and claudication.  Gastrointestinal: Negative for abdominal pain, blood in stool, constipation, diarrhea, heartburn, melena, nausea and vomiting.    Genitourinary: Negative for dysuria, flank pain, frequency, hematuria and urgency.  Musculoskeletal: Negative for back pain, joint pain and myalgias.  Skin: Negative for rash.  Neurological: Positive for sensory change (Peripheral neuropathy). Negative for dizziness, tingling, focal weakness, seizures, weakness and headaches.  Endo/Heme/Allergies: Does not bruise/bleed easily.  Psychiatric/Behavioral: Negative for depression and suicidal ideas. The patient does not have insomnia.     Allergies  Allergen Reactions  . Contrast Media [Iodinated Diagnostic Agents] Other (See Comments)    Severe abdominal pain    Patient Active Problem List   Diagnosis Date Noted  . Fibroids, submucosal 10/02/2018  . Chest pain 11/12/2017  . Nausea alone 05/02/2012  . Pernicious anemia 09/16/2011     Past Medical History:  Diagnosis Date  . Anemia   . Anxiety   . Complication of anesthesia   . GERD (gastroesophageal reflux disease)    RARE-NO MEDS  . Headache    H/O MIGRAINES  . Panic attacks january 2013  . PONV (postoperative nausea and vomiting)      Past Surgical History:  Procedure Laterality Date  . CESAREAN SECTION    . CYSTOSCOPY  10/02/2018   Procedure: CYSTOSCOPY;  Surgeon: Harlin Heys, MD;  Location: [facility] ORS;  Service: Gynecology;;  . LAPAROSCOPIC VAGINAL HYSTERECTOMY WITH SALPINGO OOPHORECTOMY Left 10/02/2018   Procedure: LAPAROSCOPIC ASSISTED VAGINAL HYSTERECTOMY WITH LEFT SALPINGO OOPHORECTOMY;  Surgeon: Harlin Heys, MD;  Location: [facility] OR[facility];  Service: Gynecology;  Laterality: Left;  . RIGHT OOPHORECTOMY      Social History   Socioeconomic History  . Marital status: Single    Spouse name: Not on file  . Number of children: Not  on file  . Years of education: Not on file  . Highest education level: Not on file  Occupational History    Employer: [employer]  Tobacco Use  . Smoking status: Never Smoker  . Smokeless tobacco: Never Used  Vaping  Use  . Vaping Use: Never assessed  Substance and Sexual Activity  . Alcohol use: No  . Drug use: No  . Sexual activity: Yes  Other Topics Concern  . Not on file  Social History Narrative  . Not on file   Social Determinants of Health   Financial Resource Strain:   . Difficulty of Paying Living Expenses:   Food Insecurity:   . Worried About Charity fundraiser in the Last Year:   . Arboriculturist in the Last Year:   Transportation Needs:   . Film/video editor (Medical):   Marland Kitchen Lack of Transportation (Non-Medical):   Physical Activity:   . Days of Exercise per Week:   . Minutes of Exercise per Session:   Stress:   . Feeling of Stress :   Social Connections:   . Frequency of Communication with Friends and Family:   . Frequency of Social Gatherings with Friends and Family:   . Attends Religious Services:   . Active Member of Clubs or Organizations:   . Attends Archivist Meetings:   Marland Kitchen Marital Status:   Intimate Partner Violence:   . Fear of Current or Ex-Partner:   . Emotionally Abused:   Marland Kitchen Physically Abused:   . Sexually Abused:      No family history on file.   Current Outpatient Medications:  .  Cyanocobalamin (NASCOBAL) 500 MCG/0.1ML SOLN, Place 500 mcg into the nose every Friday. , Disp: , Rfl:  .  PREMARIN 0.625 MG tablet, TAKE 1 TABLET BY MOUTH DAILY (Patient taking differently: Take 0.625 mg by mouth at bedtime. ), Disp: 90 tablet, Rfl: 3   Physical exam:  Vitals:   07/07/20 1012  BP: 114/75  Pulse: 80  Resp: 18  Temp: (!) 96.9 F (36.1 C)  TempSrc: Tympanic  SpO2: 97%  Weight: 175 lb 8 oz (79.6 kg)   Physical Exam Constitutional:      General: She is not in acute distress. Cardiovascular:     Rate and Rhythm: Normal rate and regular rhythm.     Heart sounds: Normal heart sounds.  Pulmonary:     Effort: Pulmonary effort is normal.     Breath sounds: Normal breath sounds.  Abdominal:     General: Bowel sounds are normal.      Palpations: Abdomen is soft.  Lymphadenopathy:     Comments: No palpable cervical, supraclavicular, axillary or inguinal adenopathy   Skin:    General: Skin is warm and dry.  Neurological:     General: No focal deficit present.     Mental Status: She is alert and oriented to person, place, and time.        CMP Latest Ref Rng & Units 06/27/2020  Glucose 70 - 99 mg/dL 110(H)  BUN 6 - 20 mg/dL 10  Creatinine 0.44 - 1.00 mg/dL 0.50  Sodium 135 - 145 mmol/L 142  Potassium 3.5 - 5.1 mmol/L 3.5  Chloride 98 - 111 mmol/L 106  CO2 22 - 32 mmol/L -  Calcium 8.9 - 10.3 mg/dL -  Total Protein 6.5 - 8.1 g/dL -  Total Bilirubin 0.3 - 1.2 mg/dL -  Alkaline Phos 38 - 126 U/L -  AST 15 -  41 U/L -  ALT 0 - 44 U/L -   CBC Latest Ref Rng & Units 06/27/2020  WBC 4.0 - 10.5 K/uL -  Hemoglobin 12.0 - 15.0 g/dL 12.6  Hematocrit 36 - 46 % 37.0  Platelets 150 - 400 K/uL -    No images are attached to the encounter.  MR ANGIO HEAD WO CONTRAST  Result Date: 06/27/2020 CLINICAL DATA:  49 year old female code stroke presentation this morning. Left facial droop. EXAM: MRI HEAD WITHOUT CONTRAST MRA HEAD WITHOUT CONTRAST MRA NECK WITHOUT CONTRAST TECHNIQUE: Multiplanar, multiecho pulse sequences of the brain and surrounding structures were obtained without intravenous contrast. Angiographic images of the Circle of Willis were obtained using MRA technique without intravenous contrast. Angiographic images of the neck were obtained using MRA technique without intravenous contrast. Carotid stenosis measurements (when applicable) are obtained utilizing NASCET criteria, using the distal internal carotid diameter as the denominator. COMPARISON:  Head CT without contrast 0538 hours. FINDINGS: MRI HEAD FINDINGS Brain: No restricted diffusion to suggest acute infarction. No midline shift, mass effect, evidence of mass lesion, ventriculomegaly, extra-axial collection or acute intracranial hemorrhage. Cervicomedullary  junction and pituitary are within normal limits. Normal cerebral volume. Pearline Cables and white matter signal is within normal limits throughout the brain. No definite encephalomalacia or chronic cerebral blood products identified Vascular: Major intracranial vascular flow voids are preserved. Skull and upper cervical spine: Negative visible cervical spine. Visualized bone marrow signal is within normal limits. Sinuses/Orbits: Negative orbits. Scattered mild paranasal sinus mucosal thickening. Hypoplastic frontal sinuses. No sinus fluid levels. Other: Visible internal auditory structures appear normal. Mastoids are clear. Normal stylomastoid foramina. Bilateral parotid glands, as well as other visible face and scalp soft tissues appear within normal limits. MRA NECK FINDINGS Time-of-flight imaging demonstrates antegrade flow signal in the bilateral cervical carotid and vertebral arteries. Both carotid bifurcations appear normal. The right vertebral artery appears somewhat dominant. Antegrade flow signal is present to the skull base. The vertebrals appear within normal limits. MRA HEAD FINDINGS Antegrade flow in the posterior circulation with dominant distal right vertebral artery. No distal vertebral stenosis, the left vertebral remains patent to the vertebrobasilar junction. Patent basilar artery without stenosis. Patent AICA, SCA and PCA origins. Fetal type left PCA. The right posterior communicating artery is also present. Bilateral PCA branches are within normal limits. Antegrade flow in both ICA siphons. No convincing siphon stenosis. Posterior communicating artery origins are within normal limits. Patent carotid termini. Normal MCA and ACA origins. Diminutive or absent anterior communicating artery. Visible ACA branches are within normal limits. Bilateral MCA M1 segments and MCA bi/trifurcations are patent. Visible bilateral MCA branches are within normal limits. IMPRESSION: 1. No acute intracranial abnormality. 2.  Normal noncontrast MRI appearance of the brain. 3. MRAs of the head and neck are unremarkable. Electronically Signed   By: Genevie Ann M.D.   On: 06/27/2020 08:56   MR ANGIO NECK WO CONTRAST  Result Date: 06/27/2020 CLINICAL DATA:  49 year old female code stroke presentation this morning. Left facial droop. EXAM: MRI HEAD WITHOUT CONTRAST MRA HEAD WITHOUT CONTRAST MRA NECK WITHOUT CONTRAST TECHNIQUE: Multiplanar, multiecho pulse sequences of the brain and surrounding structures were obtained without intravenous contrast. Angiographic images of the Circle of Willis were obtained using MRA technique without intravenous contrast. Angiographic images of the neck were obtained using MRA technique without intravenous contrast. Carotid stenosis measurements (when applicable) are obtained utilizing NASCET criteria, using the distal internal carotid diameter as the denominator. COMPARISON:  Head CT without contrast 0538 hours.  FINDINGS: MRI HEAD FINDINGS Brain: No restricted diffusion to suggest acute infarction. No midline shift, mass effect, evidence of mass lesion, ventriculomegaly, extra-axial collection or acute intracranial hemorrhage. Cervicomedullary junction and pituitary are within normal limits. Normal cerebral volume. Pearline Cables and white matter signal is within normal limits throughout the brain. No definite encephalomalacia or chronic cerebral blood products identified Vascular: Major intracranial vascular flow voids are preserved. Skull and upper cervical spine: Negative visible cervical spine. Visualized bone marrow signal is within normal limits. Sinuses/Orbits: Negative orbits. Scattered mild paranasal sinus mucosal thickening. Hypoplastic frontal sinuses. No sinus fluid levels. Other: Visible internal auditory structures appear normal. Mastoids are clear. Normal stylomastoid foramina. Bilateral parotid glands, as well as other visible face and scalp soft tissues appear within normal limits. MRA NECK FINDINGS  Time-of-flight imaging demonstrates antegrade flow signal in the bilateral cervical carotid and vertebral arteries. Both carotid bifurcations appear normal. The right vertebral artery appears somewhat dominant. Antegrade flow signal is present to the skull base. The vertebrals appear within normal limits. MRA HEAD FINDINGS Antegrade flow in the posterior circulation with dominant distal right vertebral artery. No distal vertebral stenosis, the left vertebral remains patent to the vertebrobasilar junction. Patent basilar artery without stenosis. Patent AICA, SCA and PCA origins. Fetal type left PCA. The right posterior communicating artery is also present. Bilateral PCA branches are within normal limits. Antegrade flow in both ICA siphons. No convincing siphon stenosis. Posterior communicating artery origins are within normal limits. Patent carotid termini. Normal MCA and ACA origins. Diminutive or absent anterior communicating artery. Visible ACA branches are within normal limits. Bilateral MCA M1 segments and MCA bi/trifurcations are patent. Visible bilateral MCA branches are within normal limits. IMPRESSION: 1. No acute intracranial abnormality. 2. Normal noncontrast MRI appearance of the brain. 3. MRAs of the head and neck are unremarkable. Electronically Signed   By: Genevie Ann M.D.   On: 06/27/2020 08:56   MR BRAIN WO CONTRAST  Result Date: 06/27/2020 CLINICAL DATA:  49 year old female code stroke presentation this morning. Left facial droop. EXAM: MRI HEAD WITHOUT CONTRAST MRA HEAD WITHOUT CONTRAST MRA NECK WITHOUT CONTRAST TECHNIQUE: Multiplanar, multiecho pulse sequences of the brain and surrounding structures were obtained without intravenous contrast. Angiographic images of the Circle of Willis were obtained using MRA technique without intravenous contrast. Angiographic images of the neck were obtained using MRA technique without intravenous contrast. Carotid stenosis measurements (when applicable) are  obtained utilizing NASCET criteria, using the distal internal carotid diameter as the denominator. COMPARISON:  Head CT without contrast 0538 hours. FINDINGS: MRI HEAD FINDINGS Brain: No restricted diffusion to suggest acute infarction. No midline shift, mass effect, evidence of mass lesion, ventriculomegaly, extra-axial collection or acute intracranial hemorrhage. Cervicomedullary junction and pituitary are within normal limits. Normal cerebral volume. Pearline Cables and white matter signal is within normal limits throughout the brain. No definite encephalomalacia or chronic cerebral blood products identified Vascular: Major intracranial vascular flow voids are preserved. Skull and upper cervical spine: Negative visible cervical spine. Visualized bone marrow signal is within normal limits. Sinuses/Orbits: Negative orbits. Scattered mild paranasal sinus mucosal thickening. Hypoplastic frontal sinuses. No sinus fluid levels. Other: Visible internal auditory structures appear normal. Mastoids are clear. Normal stylomastoid foramina. Bilateral parotid glands, as well as other visible face and scalp soft tissues appear within normal limits. MRA NECK FINDINGS Time-of-flight imaging demonstrates antegrade flow signal in the bilateral cervical carotid and vertebral arteries. Both carotid bifurcations appear normal. The right vertebral artery appears somewhat dominant. Antegrade flow signal is present  to the skull base. The vertebrals appear within normal limits. MRA HEAD FINDINGS Antegrade flow in the posterior circulation with dominant distal right vertebral artery. No distal vertebral stenosis, the left vertebral remains patent to the vertebrobasilar junction. Patent basilar artery without stenosis. Patent AICA, SCA and PCA origins. Fetal type left PCA. The right posterior communicating artery is also present. Bilateral PCA branches are within normal limits. Antegrade flow in both ICA siphons. No convincing siphon stenosis.  Posterior communicating artery origins are within normal limits. Patent carotid termini. Normal MCA and ACA origins. Diminutive or absent anterior communicating artery. Visible ACA branches are within normal limits. Bilateral MCA M1 segments and MCA bi/trifurcations are patent. Visible bilateral MCA branches are within normal limits. IMPRESSION: 1. No acute intracranial abnormality. 2. Normal noncontrast MRI appearance of the brain. 3. MRAs of the head and neck are unremarkable. Electronically Signed   By: Genevie Ann M.D.   On: 06/27/2020 08:56   CT HEAD CODE STROKE WO CONTRAST  Result Date: 06/27/2020 CLINICAL DATA:  Code stroke.  Left facial droop. EXAM: CT HEAD WITHOUT CONTRAST TECHNIQUE: Contiguous axial images were obtained from the base of the skull through the vertex without intravenous contrast. COMPARISON:  08/20/2012 FINDINGS: Brain: No evidence of acute infarction, hemorrhage, hydrocephalus, extra-axial collection or mass lesion/mass effect. Vascular: No hyperdense vessel or unexpected calcification. Skull: Normal. Negative for fracture or focal lesion. Sinuses/Orbits: No acute finding. Other: These results were communicated to Dr. Lorraine Lax at [place] 8/6/2021by text page via the [facility] messaging system. ASPECTS [facility] Stroke Program Early CT Score) - Ganglionic level infarction (caudate, lentiform nuclei, internal capsule, insula, M1-M3 cortex): 7 - Supraganglionic infarction (M4-M6 cortex): 3 Total score (0-10 with 10 being normal): 10 IMPRESSION: Negative head CT. Electronically Signed   By: Monte Fantasia M.D.   On: 06/27/2020 05:43    Assessment and plan- Patient is a 49 y.o. female referred for history of B12 deficiency  Patient is currently taking intranasal B12 once a week.  Last levels were checked in February 2020.  Today I will check a CBC with differential, ferritin and iron studies, B12 and folate, TSH, reticulocyte count and myeloma panel.  In person or video visit in 2 weeks  time  If the results of lab work are unremarkable, I would recommend neurology referral   Thank you for this kind referral and the opportunity to participate in the care of this patient   Visit Diagnosis 1. B12 deficiency     Dr. Randa Evens, MD, MPH [facility] [phone] 07/07/2020 11:05 AM

## 2020-07-08 LAB — KAPPA/LAMBDA LIGHT CHAINS
Kappa free light chain: 25.9 mg/L — ABNORMAL HIGH (ref 3.3–19.4)
Kappa, lambda light chain ratio: 0.99 (ref 0.26–1.65)
Lambda free light chains: 26.2 mg/L (ref 5.7–26.3)

## 2020-07-09 LAB — MULTIPLE MYELOMA PANEL, SERUM
Albumin SerPl Elph-Mcnc: 3.4 g/dL (ref 2.9–4.4)
Albumin/Glob SerPl: 0.9 (ref 0.7–1.7)
Alpha 1: 0.3 g/dL (ref 0.0–0.4)
Alpha2 Glob SerPl Elph-Mcnc: 0.8 g/dL (ref 0.4–1.0)
B-Globulin SerPl Elph-Mcnc: 1.4 g/dL — ABNORMAL HIGH (ref 0.7–1.3)
Gamma Glob SerPl Elph-Mcnc: 1.7 g/dL (ref 0.4–1.8)
Globulin, Total: 4.1 g/dL — ABNORMAL HIGH (ref 2.2–3.9)
IgA: 391 mg/dL — ABNORMAL HIGH (ref 87–352)
IgG (Immunoglobin G), Serum: 1662 mg/dL — ABNORMAL HIGH (ref 586–1602)
IgM (Immunoglobulin M), Srm: 102 mg/dL (ref 26–217)
Total Protein ELP: 7.5 g/dL (ref 6.0–8.5)

## 2020-07-18 ENCOUNTER — Telehealth: Payer: Self-pay | Admitting: *Deleted

## 2020-07-18 ENCOUNTER — Inpatient Hospital Stay (HOSPITAL_BASED_OUTPATIENT_CLINIC_OR_DEPARTMENT_OTHER): Payer: BC Managed Care – PPO | Admitting: Oncology

## 2020-07-18 DIAGNOSIS — D508 Other iron deficiency anemias: Secondary | ICD-10-CM | POA: Diagnosis not present

## 2020-07-18 DIAGNOSIS — Z8639 Personal history of other endocrine, nutritional and metabolic disease: Secondary | ICD-10-CM | POA: Diagnosis not present

## 2020-07-18 NOTE — Telephone Encounter (Signed)
Video Visit done this afternoon

## 2020-07-18 NOTE — Progress Notes (Signed)
Head to toe  Body aches Severe shortness of breath Fatigue and extremely exhausted and head aches

## 2020-07-18 NOTE — Telephone Encounter (Signed)
Patient called reporting that she has had gradual increase of shortness of breath, but today she states it is really bad where she feels she is gasping for air, but does not feel it is bad enough that she needs to go to ER for it. She denies cough or fever. She states that her labs are off and that doctor talked about doing a possible iron infusion. She is requesting a return call [phone] Ferritin Order: [id number] Status:  Final result Visible to patient:  Yes (seen) Next appt:  07/21/2020 at 02:30 PM in Oncology Sindy Guadeloupe, MD) Dx:  B12 deficiency  0 Result Notes  Ref Range & Units 11 d ago 5 yr ago  Ferritin 11 - 307 ng/mL 9Low  107 R   Comment: Performed at [facility], [address] [place], [place] [address]  Resulting Agency  [facility] CLIN LAB [facility]      Specimen Collected: 07/07/20 11:08 Last Resulted: 07/07/20 12:44     Lab Flowsheet   Order Details   View Encounter   Lab and Collection Details   Routing   Result History     R=Reference range differs from displayed range    Result Care Coordination  Patient Communication  Add Comments Seen Back to Top      Other Results from 07/07/2020  Folate  Status:  Final result Visible to patient:  Yes (seen) Next appt:  07/21/2020 at 02:30 PM in Oncology Sindy Guadeloupe, MD) Dx:  B12 deficiency Order: [id number]  0 Result Notes  Ref Range & Units 11 d ago 6 yr ago  Folate >5.9 ng/mL 13.9  >20.0 R, CM   Comment: Performed at [facility], [address] [place], [place] [address]  Resulting Agency  [facility] CLIN LAB [facility]      Specimen Collected: 07/07/20 11:08 Last Resulted: 07/07/20 12:44     Lab Flowsheet   Order Details   View Encounter   Lab and Collection Details   Routing   Result History     CM=Additional commentsR=Reference range differs from displayed range    Result Care Coordination  Patient Communication  Add Comments Seen Back to Top         Contains abnormal dataKappa/lambda light chains  Status:  Final result Visible to patient:  Yes (seen) Next appt:  07/21/2020 at 02:30 PM in Oncology Sindy Guadeloupe, MD) Dx:  B12 deficiency Order: [id number]  0 Result Notes  Ref Range & Units 11 d ago  Kappa free light chain 3.3 - 19.4 mg/L 25.9High   Lamda free light chains 5.7 - 26.3 mg/L 26.2   Kappa, lamda light chain ratio 0.26 - 1.65 0.99   Comment: (NOTE)  Performed At: [facility]  [place], [place] [id number]  Rush Farmer MD [id number]   Resulting Agency  [facility] CLIN LAB      Specimen Collected: 07/07/20 11:08 Last Resulted: 07/08/20 15:37     Lab Flowsheet   Order Details   View Encounter   Lab and Collection Details   Routing   Result History       Result Care Coordination  Patient Communication  Add Comments Seen Back to Top        Contains abnormal dataMultiple Myeloma Panel (SPEP&IFE w/QIG)  Status:  Edited Result - FINAL Visible to patient:  Yes (seen) Next appt:  07/21/2020 at 02:30 PM in Oncology Sindy Guadeloupe, MD) Dx:  B12 deficiency Order: [id number]  0  Result Notes  Ref Range & Units 11 d ago 8 yr ago  IgG (Immunoglobin G), Serum 586 - 1,602 mg/dL 1,662High  1,890High R   IgA 87 - 352 mg/dL 391High  405High R   IgM (Immunoglobulin M), Srm 26 - 217 mg/dL 102    Total Protein ELP 6.0 - 8.5 g/dL 7.5 VC    Albumin SerPl Elph-Mcnc 2.9 - 4.4 g/dL 3.4 VC    Alpha 1 0.0 - 0.4 g/dL 0.3 VC    Alpha2 Glob SerPl Elph-Mcnc 0.4 - 1.0 g/dL 0.8 VC    B-Globulin SerPl Elph-Mcnc 0.7 - 1.3 g/dL 1.4High VC    Gamma Glob SerPl Elph-Mcnc 0.4 - 1.8 g/dL 1.7 VC    M Protein SerPl Elph-Mcnc Not Observed g/dL Not Observed VC    Globulin, Total 2.2 - 3.9 g/dL 4.1High VC    Albumin/Glob SerPl 0.7 - 1.7 0.9 VC    IFE 1  CommentAbnormal VC    Comment: Polyclonal increase detected in one or more immunoglobulins.  Please Note  Comment VC    Comment: (NOTE)  Protein  electrophoresis scan will follow via computer, mail, or  courier delivery.  Performed At: [facility]  [place], [place] [id number]  Rush Farmer MD [id number]   Resulting Agency  [facility] CLIN LAB [facility]      Specimen Collected: 07/07/20 11:08 Last Resulted: 07/09/20 16:38     Lab Flowsheet   Order Details   View Encounter   Lab and Collection Details   Routing   Result History     VC=Value has a corrected status R=Reference range differs from displayed range    Result Care Coordination  Patient Communication  Add Comments Seen Back to Top        Contains abnormal dataComprehensive metabolic panel  Status:  Final result Visible to patient:  Yes (seen) Next appt:  07/21/2020 at 02:30 PM in Oncology Sindy Guadeloupe, MD) Dx:  B12 deficiency Order: [id number]  0 Result Notes  Ref Range & Units 11 d ago 3 wk ago  Sodium 135 - 145 mmol/L 139  142   Potassium 3.5 - 5.1 mmol/L 3.9  3.5   Chloride 98 - 111 mmol/L 105  106   CO2 22 - 32 mmol/L 27    Glucose, Bld 70 - 99 mg/dL 98  110High CM   Comment: Glucose reference range applies only to samples taken after fasting for at least 8 hours.  BUN 6 - 20 mg/dL 10  10   Creatinine, Ser 0.44 - 1.00 mg/dL 0.62  0.50   Calcium 8.9 - 10.3 mg/dL 8.8Low    Total Protein 6.5 - 8.1 g/dL 8.2High    Albumin 3.5 - 5.0 g/dL 3.8    AST 15 - 41 U/L 12Low    ALT 0 - 44 U/L 15    Alkaline Phosphatase 38 - 126 U/L 69    Total Bilirubin 0.3 - 1.2 mg/dL 0.4    GFR calc non Af Amer >60 mL/min >60    GFR calc Af Amer >60 mL/min >60    Anion gap 5 - 15 7    Comment: Performed at [facility], [address] [place], [place] [address]  Resulting Agency  [facility] CLIN LAB [facility] CLIN LAB      Specimen Collected: 07/07/20 11:08 Last Resulted: 07/07/20 11:36     Lab Flowsheet   Order Details   View Encounter   Lab and Collection Details  Routing   Result History     CM=Additional  comments    Result Care Coordination  Patient Communication  Add Comments Seen Back to Top        Reticulocytes  Status:  Final result Visible to patient:  Yes (seen) Next appt:  07/21/2020 at 02:30 PM in Oncology Sindy Guadeloupe, MD) Dx:  B12 deficiency Order: [id number]  0 Result Notes  Ref Range & Units 11 d ago  Retic Ct Pct 0.4 - 3.1 % 2.1   RBC. 3.87 - 5.11 MIL/uL 4.53   Retic Count, Absolute 19.0 - 186.0 K/uL 95.6   Immature Retic Fract 2.3 - 15.9 % 6.4   Comment: Performed at [facility], [address] [place], [place] [address]  Resulting Agency  [facility] CLIN LAB      Specimen Collected: 07/07/20 11:08 Last Resulted: 07/07/20 11:22     Lab Flowsheet   Order Details   View Encounter   Lab and Collection Details   Routing   Result History       Result Care Coordination  Patient Communication  Add Comments Seen Back to Top        TSH  Status:  Final result Visible to patient:  Yes (seen) Next appt:  07/21/2020 at 02:30 PM in Oncology Sindy Guadeloupe, MD) Dx:  B12 deficiency Order: [id number]  0 Result Notes  Ref Range & Units 11 d ago  TSH 0.350 - 4.500 uIU/mL 3.314   Comment: Performed by a 3rd Generation assay with a functional sensitivity of <=0.01 uIU/mL.  Performed at [facility], [address] [place], [place] [address]   Resulting Agency  [facility] CLIN LAB      Specimen Collected: 07/07/20 11:08 Last Resulted: 07/07/20 12:44     Lab Flowsheet   Order Details   View Encounter   Lab and Collection Details   Routing   Result History       Result Care Coordination  Patient Communication  Add Comments Seen Back to Top        Vitamin B12  Status:  Final result Visible to patient:  Yes (seen) Next appt:  07/21/2020 at 02:30 PM in Oncology Sindy Guadeloupe, MD) Dx:  B12 deficiency Order: [id number]  0 Result Notes  Ref Range & Units 11 d ago 5 yr ago  Vitamin B-12 180 - 914 pg/mL 350  1,008High R    Comment: (NOTE)  This assay is not validated for testing neonatal or  myeloproliferative syndrome specimens for Vitamin B12 levels.  Performed at [facility], [address], [place], [place]  [address]   Resulting Agency  [facility] CLIN LAB [facility]      Specimen Collected: 07/07/20 11:08 Last Resulted: 07/07/20 16:49     Lab Flowsheet   Order Details   View Encounter   Lab and Collection Details   Routing   Result History     R=Reference range differs from displayed range    Result Care Coordination  Patient Communication  Add Comments Seen Back to Top        Contains abnormal dataIron and TIBC  Status:  Final result Visible to patient:  Yes (seen) Next appt:  07/21/2020 at 02:30 PM in Oncology Sindy Guadeloupe, MD) Dx:  B12 deficiency Order: [id number]  0 Result Notes  Ref Range & Units 11 d ago 5 yr ago  Iron 28 - 170 ug/dL 34  72 R   TIBC 250 -  450 ug/dL 463High  263 R   Saturation Ratios 10.4 - 31.8 % 7Low    UIBC ug/dL 429  191 R   Comment: Performed at [facility], [address] [place], [place] [address]  Resulting Agency  [facility] CLIN LAB [facility]      Specimen Collected: 07/07/20 11:08 Last Resulted: 07/07/20 12:44     Lab Flowsheet   Order Details   View Encounter   Lab and Collection Details   Routing   Result History     R=Reference range differs from displayed range    Result Care Coordination  Patient Communication  Add Comments Seen Back to Top        CBC with Differential/Platelet  Status:  Final result Visible to patient:  Yes (seen) Next appt:  07/21/2020 at 02:30 PM in Oncology Sindy Guadeloupe, MD) Dx:  B12 deficiency Order: [id number]  0 Result Notes  Ref Range & Units 11 d ago 3 wk ago  WBC 4.0 - 10.5 K/uL 6.5    RBC 3.87 - 5.11 MIL/uL 4.53    Hemoglobin 12.0 - 15.0 g/dL 12.7  12.6   HCT 36 - 46 % 37.3  37.0   MCV 80.0 - 100.0 fL 82.3    MCH 26.0 - 34.0 pg 28.0    MCHC 30.0 - 36.0  g/dL 34.0    RDW 11.5 - 15.5 % 12.2    Platelets 150 - 400 K/uL 293    nRBC 0.0 - 0.2 % 0.0    Neutrophils Relative % % 63    Neutro Abs 1.7 - 7.7 K/uL 4.1    Lymphocytes Relative % 27    Lymphs Abs 0.7 - 4.0 K/uL 1.8    Monocytes Relative % 6    Monocytes Absolute 0 - 1 K/uL 0.4    Eosinophils Relative % 2    Eosinophils Absolute 0 - 0 K/uL 0.2    Basophils Relative % 1    Basophils Absolute 0 - 0 K/uL 0.1    Immature Granulocytes % 1    Abs Immature Granulocytes 0.00 - 0.07 K/uL 0.04    Comment: Performed at [facility], [address] [place], [place] [address]  Sodium   142 R   Potassium   3.5 R   Chloride   106 R   BUN   10 R   Creatinine, Ser   0.50 R   Glucose, Bld   110High R, CM   Calcium, Ion   1.14Low R   TCO2   22 R   Resulting Agency  [place] CLIN LAB [place] [facility]IN LAB      Specimen Collected: 07/07/20 11:08 Last Resulted: 07/07/20 11:23

## 2020-07-21 ENCOUNTER — Inpatient Hospital Stay: Payer: BC Managed Care – PPO | Admitting: Oncology

## 2020-07-21 ENCOUNTER — Encounter: Payer: Self-pay | Admitting: Oncology

## 2020-07-21 NOTE — Progress Notes (Signed)
I connected with Awilda Bill on 07/21/20 at  3:00 PM EDT by video enabled telemedicine visit and verified that I am speaking with the correct person using two identifiers.   I discussed the limitations, risks, security and privacy concerns of performing an evaluation and management service by telemedicine and the availability of in-person appointments. I also discussed with the patient that there may be a patient responsible charge related to this service. The patient expressed understanding and agreed to proceed.  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  She was previously seen [facility] the [facility] 2017 by Dr. Lindi Adie.  She has been using B12 nasal spray once every week.  However patient reports she has been having symptoms of worsening depression shortness of breath as well as cognitive difficulties last B12 level checked in February 2020 was 672.Most recent CBC from 06/27/2020 showed white count of 6.1, H&H of 12.4/38.2 with an MCV of 84 and a platelet count of 250.  Patient reports that currently she has difficulty in word finding at times and something else comes out of her mouth when she is meant something completely different.  Appetite and weight is currently stable.  She continues to be active and independent of her ADLs and IADLs.  Reports tingling numbness in her bilateral hands and occasionally pain in her bilateral feet.  Neuropathy sometimes causes her to lose grip of objects  Results of blood work from 07/07/2020 showed white count of 6.5, H&H of 12.7/37.3 and a platelet count of 293.  Iron study showed a low iron saturation of 7% and elevated TIBC of 463.  Ferritin levels were low at 9.  B12 levels were  normal at 350.  TSH was normal.  Reticulocyte count was normal at 2.1%.  CMP was unremarkable.  Folate levels were normal.  Myeloma panel showed no M protein.  Polyclonal increase in immunoglobulins.  Serum free light chain ratio was normal.   Interval history he reports having ongoing fatigue and feels dizzy at times.She is unable to focus at work and sometimes has word finding difficulty.   Review of Systems  Constitutional: Positive for malaise/fatigue. Negative for chills, fever and weight loss.  HENT: Negative for congestion, ear discharge and nosebleeds.   Eyes: Negative for blurred vision.  Respiratory: Negative for cough, hemoptysis, sputum production, shortness of breath and wheezing.   Cardiovascular: Negative for chest pain, palpitations, orthopnea and claudication.  Gastrointestinal: Negative for abdominal pain, blood in stool, constipation, diarrhea, heartburn, melena, nausea and vomiting.  Genitourinary: Negative for dysuria, flank pain, frequency, hematuria and urgency.  Musculoskeletal: Negative for back pain, joint pain and myalgias.  Skin: Negative for rash.  Neurological: Positive for dizziness. Negative for tingling, focal weakness, seizures, weakness and headaches.  Endo/Heme/Allergies: Does not bruise/bleed easily.  Psychiatric/Behavioral: Negative for depression and suicidal ideas. The patient does not have insomnia.     Allergies  Allergen Reactions  . Contrast Media [Iodinated Diagnostic Agents] Other (See Comments)    Severe abdominal pain    Past Medical History:  Diagnosis Date  . Anemia   . Anxiety   . Complication of anesthesia   . GERD (gastroesophageal reflux disease)    RARE-NO MEDS  . Headache    H/O  MIGRAINES  . Panic attacks january 2013  . PONV (postoperative nausea and vomiting)     Past Surgical History:  Procedure Laterality Date  . CESAREAN SECTION    . CYSTOSCOPY  10/02/2018   Procedure: CYSTOSCOPY;  Surgeon: Harlin Heys,  MD;  Location: [facility] ORS;  Service: Gynecology;;  . LAPAROSCOPIC VAGINAL HYSTERECTOMY WITH SALPINGO OOPHORECTOMY Left 10/02/2018   Procedure: LAPAROSCOPIC ASSISTED VAGINAL HYSTERECTOMY WITH LEFT SALPINGO OOPHORECTOMY;  Surgeon: Harlin Heys, MD;  Location: [facility] ORS;  Service: Gynecology;  Laterality: Left;  . RIGHT OOPHORECTOMY      Social History   Socioeconomic History  . Marital status: Single    Spouse name: Not on file  . Number of children: Not on file  . Years of education: Not on file  . Highest education level: Not on file  Occupational History    Employer: [employer]  Tobacco Use  . Smoking status: Never Smoker  . Smokeless tobacco: Never Used  Vaping Use  . Vaping Use: Never assessed  Substance and Sexual Activity  . Alcohol use: No  . Drug use: No  . Sexual activity: Yes  Other Topics Concern  . Not on file  Social History Narrative  . Not on file   Social Determinants of Health   Financial Resource Strain:   . Difficulty of Paying Living Expenses: Not on file  Food Insecurity:   . Worried About Charity fundraiser in the Last Year: Not on file  . Ran Out of Food in the Last Year: Not on file  Transportation Needs:   . Lack of Transportation (Medical): Not on file  . Lack of Transportation (Non-Medical): Not on file  Physical Activity:   . Days of Exercise per Week: Not on file  . Minutes of Exercise per Session: Not on file  Stress:   . Feeling of Stress : Not on file  Social Connections:   . Frequency of Communication with Friends and Family: Not on file  . Frequency of Social Gatherings with Friends and Family: Not on file  . Attends Religious Services: Not on file  . Active Member of Clubs or Organizations: Not on file  . Attends Archivist Meetings: Not on file  . Marital Status: Not on file  Intimate Partner Violence:   . Fear of Current or Ex-Partner: Not on file  . Emotionally Abused: Not on file  . Physically  Abused: Not on file  . Sexually Abused: Not on file    No family history on file.   Current Outpatient Medications:  .  Cyanocobalamin (NASCOBAL) 500 MCG/0.1ML SOLN, Place 500 mcg into the nose every Friday. , Disp: , Rfl:  .  PREMARIN 0.625 MG tablet, TAKE 1 TABLET BY MOUTH DAILY (Patient taking differently: Take 0.625 mg by mouth at bedtime. ), Disp: 90 tablet, Rfl: 3  MR ANGIO HEAD WO CONTRAST  Result Date: 06/27/2020 CLINICAL DATA:  49 year old female code stroke presentation this morning. Left facial droop. EXAM: MRI HEAD WITHOUT CONTRAST MRA HEAD WITHOUT CONTRAST MRA NECK WITHOUT CONTRAST TECHNIQUE: Multiplanar, multiecho pulse sequences of the brain and surrounding structures were obtained without intravenous contrast. Angiographic images of the Circle of Willis were obtained using MRA technique without intravenous contrast. Angiographic images of the neck were obtained using MRA technique without intravenous contrast. Carotid stenosis measurements (when applicable) are obtained utilizing NASCET criteria, using the distal internal carotid diameter as the denominator. COMPARISON:  Head CT without contrast 0538 hours. FINDINGS: MRI HEAD  FINDINGS Brain: No restricted diffusion to suggest acute infarction. No midline shift, mass effect, evidence of mass lesion, ventriculomegaly, extra-axial collection or acute intracranial hemorrhage. Cervicomedullary junction and pituitary are within normal limits. Normal cerebral volume. Pear[place] Cables and white matter signal is within normal limits throughout the brain. No definite encephalomalacia or chronic cerebral blood products identified Vascular: Major intracranial vascular flow voids are preserved. Skull and upper cervical spine: Negative visible cervical spine. Visualized bone marrow signal is within normal limits. Sinuses/Orbits: Negative orbits. Scattered mild paranasal sinus mucosal thickening. Hypoplastic frontal sinuses. No sinus fluid levels. Other: Visible  internal auditory structures appear normal. Mastoids are clear. Normal stylomastoid foramina. Bilateral parotid glands, as well as other visible face and scalp soft tissues appear within normal limits. MRA NECK FINDINGS Time-of-flight imaging demonstrates antegrade flow signal in the bilateral cervical carotid and vertebral arteries. Both carotid bifurcations appear normal. The right vertebral artery appears somewhat dominant. Antegrade flow signal is present to the skull base. The vertebrals appear within normal limits. MRA HEAD FINDINGS Antegrade flow in the posterior circulation with dominant distal right vertebral artery. No distal vertebral stenosis, the left vertebral remains patent to the vertebrobasilar junction. Patent basilar artery without stenosis. Patent AICA, SCA and PCA origins. Fetal type left PCA. The right posterior communicating artery is also present. Bilateral PCA branches are within normal limits. Antegrade flow in both ICA siphons. No convincing siphon stenosis. Posterior communicating artery origins are within normal limits. Patent carotid termini. Normal MCA and ACA origins. Diminutive or absent anterior communicating artery. Visible ACA branches are within normal limits. Bilateral MCA M1 segments and MCA bi/trifurcations are patent. Visible bilateral MCA branches are within normal limits. IMPRESSION: 1. No acute intracranial abnormality. 2. Normal noncontrast MRI appearance of the brain. 3. MRAs of the head and neck are unremarkable. Electronically Signed   By: Genevie Ann M.D.   On: 06/27/2020 08:56   MR ANGIO NECK WO CONTRAST  Result Date: 06/27/2020 CLINICAL DATA:  49 year old female code stroke presentation this morning. Left facial droop. EXAM: MRI HEAD WITHOUT CONTRAST MRA HEAD WITHOUT CONTRAST MRA NECK WITHOUT CONTRAST TECHNIQUE: Multiplanar, multiecho pulse sequences of the brain and surrounding structures were obtained without intravenous contrast. Angiographic images of the Circle  of Willis were obtained using MRA technique without intravenous contrast. Angiographic images of the neck were obtained using MRA technique without intravenous contrast. Carotid stenosis measurements (when applicable) are obtained utilizing NASCET criteria, using the distal internal carotid diameter as the denominator. COMPARISON:  Head CT without contrast 0538 hours. FINDINGS: MRI HEAD FINDINGS Brain: No restricted diffusion to suggest acute infarction. No midline shift, mass effect, evidence of mass lesion, ventriculomegaly, extra-axial collection or acute intracranial hemorrhage. Cervicomedullary junction and pituitary are within normal limits. Normal cerebral volume. Pear[place] Cables and white matter signal is within normal limits throughout the brain. No definite encephalomalacia or chronic cerebral blood products identified Vascular: Major intracranial vascular flow voids are preserved. Skull and upper cervical spine: Negative visible cervical spine. Visualized bone marrow signal is within normal limits. Sinuses/Orbits: Negative orbits. Scattered mild paranasal sinus mucosal thickening. Hypoplastic frontal sinuses. No sinus fluid levels. Other: Visible internal auditory structures appear normal. Mastoids are clear. Normal stylomastoid foramina. Bilateral parotid glands, as well as other visible face and scalp soft tissues appear within normal limits. MRA NECK FINDINGS Time-of-flight imaging demonstrates antegrade flow signal in the bilateral cervical carotid and vertebral arteries. Both carotid bifurcations appear normal. The right vertebral artery appears somewhat dominant. Antegrade flow signal is present to the  skull base. The vertebrals appear within normal limits. MRA HEAD FINDINGS Antegrade flow in the posterior circulation with dominant distal right vertebral artery. No distal vertebral stenosis, the left vertebral remains patent to the vertebrobasilar junction. Patent basilar artery without stenosis. Patent AICA,  SCA and PCA origins. Fetal type left PCA. The right posterior communicating artery is also present. Bilateral PCA branches are within normal limits. Antegrade flow in both ICA siphons. No convincing siphon stenosis. Posterior communicating artery origins are within normal limits. Patent carotid termini. Normal MCA and ACA origins. Diminutive or absent anterior communicating artery. Visible ACA branches are within normal limits. Bilateral MCA M1 segments and MCA bi/trifurcations are patent. Visible bilateral MCA branches are within normal limits. IMPRESSION: 1. No acute intracranial abnormality. 2. Normal noncontrast MRI appearance of the brain. 3. MRAs of the head and neck are unremarkable. Electronically Signed   By: Genevie Ann M.D.   On: 06/27/2020 08:56   MR BRAIN WO CONTRAST  Result Date: 06/27/2020 CLINICAL DATA:  49 year old female code stroke presentation this morning. Left facial droop. EXAM: MRI HEAD WITHOUT CONTRAST MRA HEAD WITHOUT CONTRAST MRA NECK WITHOUT CONTRAST TECHNIQUE: Multiplanar, multiecho pulse sequences of the brain and surrounding structures were obtained without intravenous contrast. Angiographic images of the Circle of Willis were obtained using MRA technique without intravenous contrast. Angiographic images of the neck were obtained using MRA technique without intravenous contrast. Carotid stenosis measurements (when applicable) are obtained utilizing NASCET criteria, using the distal internal carotid diameter as the denominator. COMPARISON:  Head CT without contrast 0538 hours. FINDINGS: MRI HEAD FINDINGS Brain: No restricted diffusion to suggest acute infarction. No midline shift, mass effect, evidence of mass lesion, ventriculomegaly, extra-axial collection or acute intracranial hemorrhage. Cervicomedullary junction and pituitary are within normal limits. Normal cerebral volume. Pear[place] Cables and white matter signal is within normal limits throughout the brain. No definite encephalomalacia or  chronic cerebral blood products identified Vascular: Major intracranial vascular flow voids are preserved. Skull and upper cervical spine: Negative visible cervical spine. Visualized bone marrow signal is within normal limits. Sinuses/Orbits: Negative orbits. Scattered mild paranasal sinus mucosal thickening. Hypoplastic frontal sinuses. No sinus fluid levels. Other: Visible internal auditory structures appear normal. Mastoids are clear. Normal stylomastoid foramina. Bilateral parotid glands, as well as other visible face and scalp soft tissues appear within normal limits. MRA NECK FINDINGS Time-of-flight imaging demonstrates antegrade flow signal in the bilateral cervical carotid and vertebral arteries. Both carotid bifurcations appear normal. The right vertebral artery appears somewhat dominant. Antegrade flow signal is present to the skull base. The vertebrals appear within normal limits. MRA HEAD FINDINGS Antegrade flow in the posterior circulation with dominant distal right vertebral artery. No distal vertebral stenosis, the left vertebral remains patent to the vertebrobasilar junction. Patent basilar artery without stenosis. Patent AICA, SCA and PCA origins. Fetal type left PCA. The right posterior communicating artery is also present. Bilateral PCA branches are within normal limits. Antegrade flow in both ICA siphons. No convincing siphon stenosis. Posterior communicating artery origins are within normal limits. Patent carotid termini. Normal MCA and ACA origins. Diminutive or absent anterior communicating artery. Visible ACA branches are within normal limits. Bilateral MCA M1 segments and MCA bi/trifurcations are patent. Visible bilateral MCA branches are within normal limits. IMPRESSION: 1. No acute intracranial abnormality. 2. Normal noncontrast MRI appearance of the brain. 3. MRAs of the head and neck are unremarkable. Electronically Signed   By: Genevie Ann M.D.   On: 06/27/2020 08:56   CT HEAD CODE STROKE  WO CONTRAST  Result Date: 06/27/2020 CLINICAL DATA:  Code stroke.  Left facial droop. EXAM: CT HEAD WITHOUT CONTRAST TECHNIQUE: Contiguous axial images were obtained from the base of the skull through the vertex without intravenous contrast. COMPARISON:  08/20/2012 FINDINGS: Brain: No evidence of acute infarction, hemorrhage, hydrocephalus, extra-axial collection or mass lesion/mass effect. Vascular: No hyperdense vessel or unexpected calcification. Skull: Normal. Negative for fracture or focal lesion. Sinuses/Orbits: No acute finding. Other: These results were communicated to Dr. Lorraine Lax at [facility] 8/6/2021by text page via the [facility] messaging system. ASPECTS [facility] Stroke Program Early CT Score) - Ganglionic level infarction (caudate, lentiform nuclei, internal capsule, insula, M1-M3 cortex): 7 - Supraganglionic infarction (M4-M6 cortex): 3 Total score (0-10 with 10 being normal): 10 IMPRESSION: Negative head CT. Electronically Signed   By: Monte Fantasia M.D.   On: 06/27/2020 05:43    No images are attached to the encounter.   CMP Latest Ref Rng & Units 07/07/2020  Glucose 70 - 99 mg/dL 98  BUN 6 - 20 mg/dL 10  Creatinine 0.44 - 1.00 mg/dL 0.62  Sodium 135 - 145 mmol/L 139  Potassium 3.5 - 5.1 mmol/L 3.9  Chloride 98 - 111 mmol/L 105  CO2 22 - 32 mmol/L 27  Calcium 8.9 - 10.3 mg/dL 8.8(L)  Total Protein 6.5 - 8.1 g/dL 8.2(H)  Total Bilirubin 0.3 - 1.2 mg/dL 0.4  Alkaline Phos 38 - 126 U/L 69  AST 15 - 41 U/L 12(L)  ALT 0 - 44 U/L 15   CBC Latest Ref Rng & Units 07/07/2020  WBC 4.0 - 10.5 K/uL 6.5  Hemoglobin 12.0 - 15.0 g/dL 12.7  Hematocrit 36 - 46 % 37.3  Platelets 150 - 400 K/uL 293     Observation/objective: Appears in no acute distress over video visit today.  Breathing is nonlabored  Assessment and plan: Patient is a 49 year old female referred for history of B12 deficiency anemia and this is a visit to discuss the results of her blood work  Based on her recent labs patient  is not anemic.  Her B12 levels are normal.  However she does have evidence of iron deficiency without overt anemia.  We discussed either doing oral versus IV iron for iron deficiency.  Patient currently reports multiple symptoms including fatigue subjective shortness of breath as well as dizziness and problems with focus and at times word finding difficulty.  It is unclear if all the symptoms can be fixed after correcting her iron deficiency.  Patient understands that we will proceed with IV iron and repeat her levels every 6 weeks.  However if her symptoms persist after correction of her iron deficiency she will need to speak to Dr. Kary Kos and I will also consider referring her to neurology  I would recommend 2 doses of Feraheme 510 mg IV weekly x2.  Discussed risks and benefits of Feraheme including all but not limited to headache, nausea, leg swelling and possible risk of infusion reaction.  Patient has received Feraheme in the past and has tolerated well.  She understands and agrees to proceed as planned.  For her B12 deficiency patient will continue intranasal B12  Patient is s/p hysterectomy and therefore does not have any menstrual cycles.  She denies any bleeding in her stool or urine.  Denies any dark melanotic stools or consistent use of NSAIDs.  However given her ongoing iron deficiency I will refer her to GI for further work-up.  Follow-up instructions: I will see her back in 6 weeks  with CBC ferritin and iron studies prior  I discussed the assessment and treatment plan with the patient. The patient was provided an opportunity to ask questions and all were answered. The patient agreed with the plan and demonstrated an understanding of the instructions.   The patient was advised to call back or seek an in-person evaluation if the symptoms worsen or if the condition fails to improve as anticipated.   Visit Diagnosis: 1. Other iron deficiency anemia   2. History of non anemic vitamin B12  deficiency     Dr. Randa Evens, MD, MPH [place] [facility] Tel- [phone] 07/21/2020 8:30 AM

## 2020-07-23 ENCOUNTER — Other Ambulatory Visit: Payer: Self-pay

## 2020-07-23 ENCOUNTER — Inpatient Hospital Stay: Payer: BC Managed Care – PPO | Attending: Oncology

## 2020-07-23 VITALS — BP 107/72 | HR 78 | Temp 97.3°F | Resp 18

## 2020-07-23 DIAGNOSIS — R110 Nausea: Secondary | ICD-10-CM | POA: Diagnosis present

## 2020-07-23 DIAGNOSIS — D510 Vitamin B12 deficiency anemia due to intrinsic factor deficiency: Secondary | ICD-10-CM | POA: Insufficient documentation

## 2020-07-23 MED ORDER — SODIUM CHLORIDE 0.9 % IV SOLN
510.0000 mg | Freq: Once | INTRAVENOUS | Status: AC
  Administered 2020-07-23: 510 mg via INTRAVENOUS
  Filled 2020-07-23: qty 510

## 2020-07-23 MED ORDER — ONDANSETRON HCL 4 MG/2ML IJ SOLN
4.0000 mg | Freq: Once | INTRAMUSCULAR | Status: AC
  Administered 2020-07-23: 4 mg via INTRAVENOUS
  Filled 2020-07-23: qty 2

## 2020-07-23 MED ORDER — SODIUM CHLORIDE 0.9 % IV SOLN
Freq: Once | INTRAVENOUS | Status: AC
  Filled 2020-07-23: qty 250

## 2020-07-30 ENCOUNTER — Other Ambulatory Visit: Payer: Self-pay

## 2020-07-30 ENCOUNTER — Inpatient Hospital Stay: Payer: BC Managed Care – PPO

## 2020-07-30 VITALS — BP 113/73 | HR 67 | Temp 97.8°F | Resp 18

## 2020-07-30 DIAGNOSIS — D510 Vitamin B12 deficiency anemia due to intrinsic factor deficiency: Secondary | ICD-10-CM | POA: Diagnosis not present

## 2020-07-30 MED ORDER — SODIUM CHLORIDE 0.9 % IV SOLN
510.0000 mg | Freq: Once | INTRAVENOUS | Status: AC
  Administered 2020-07-30: 510 mg via INTRAVENOUS
  Filled 2020-07-30: qty 510

## 2020-07-30 MED ORDER — ONDANSETRON HCL 4 MG/2ML IJ SOLN
4.0000 mg | Freq: Once | INTRAMUSCULAR | Status: AC
  Administered 2020-07-30: 4 mg via INTRAVENOUS
  Filled 2020-07-30: qty 2

## 2020-07-30 MED ORDER — SODIUM CHLORIDE 0.9 % IV SOLN
Freq: Once | INTRAVENOUS | Status: AC
  Filled 2020-07-30: qty 250

## 2020-07-30 NOTE — Progress Notes (Signed)
Patient reports that she normally needs Zofran prior to [facility] to help prevent nausea. MD, Dr. Janese Banks, notified and aware. Per MD verbal order: Zofran 4 mg IV push once order entered, see MAR.

## 2020-08-27 ENCOUNTER — Inpatient Hospital Stay: Payer: BC Managed Care – PPO | Attending: Oncology

## 2020-08-27 ENCOUNTER — Other Ambulatory Visit: Payer: Self-pay

## 2020-08-27 DIAGNOSIS — D508 Other iron deficiency anemias: Secondary | ICD-10-CM | POA: Insufficient documentation

## 2020-08-27 LAB — IRON AND TIBC
Iron: 83 ug/dL (ref 28–170)
Saturation Ratios: 31 % (ref 10.4–31.8)
TIBC: 272 ug/dL (ref 250–450)
UIBC: 189 ug/dL

## 2020-08-27 LAB — CBC WITH DIFFERENTIAL/PLATELET
Abs Immature Granulocytes: 0.01 10*3/uL (ref 0.00–0.07)
Basophils Absolute: 0 10*3/uL (ref 0.0–0.1)
Basophils Relative: 1 %
Eosinophils Absolute: 0.1 10*3/uL (ref 0.0–0.5)
Eosinophils Relative: 2 %
HCT: 37.3 % (ref 36.0–46.0)
Hemoglobin: 13.3 g/dL (ref 12.0–15.0)
Immature Granulocytes: 0 %
Lymphocytes Relative: 27 %
Lymphs Abs: 1.5 10*3/uL (ref 0.7–4.0)
MCH: 29.8 pg (ref 26.0–34.0)
MCHC: 35.7 g/dL (ref 30.0–36.0)
MCV: 83.6 fL (ref 80.0–100.0)
Monocytes Absolute: 0.5 10*3/uL (ref 0.1–1.0)
Monocytes Relative: 10 %
Neutro Abs: 3.3 10*3/uL (ref 1.7–7.7)
Neutrophils Relative %: 60 %
Platelets: 235 10*3/uL (ref 150–400)
RBC: 4.46 MIL/uL (ref 3.87–5.11)
RDW: 13.1 % (ref 11.5–15.5)
WBC: 5.4 10*3/uL (ref 4.0–10.5)
nRBC: 0 % (ref 0.0–0.2)

## 2020-08-27 LAB — FERRITIN: Ferritin: 254 ng/mL (ref 11–307)

## 2020-08-29 ENCOUNTER — Encounter: Payer: Self-pay | Admitting: Oncology

## 2020-08-29 ENCOUNTER — Inpatient Hospital Stay (HOSPITAL_BASED_OUTPATIENT_CLINIC_OR_DEPARTMENT_OTHER): Payer: BC Managed Care – PPO | Admitting: Oncology

## 2020-08-29 ENCOUNTER — Other Ambulatory Visit: Payer: Self-pay

## 2020-08-29 DIAGNOSIS — D509 Iron deficiency anemia, unspecified: Secondary | ICD-10-CM | POA: Diagnosis not present

## 2020-08-29 NOTE — Progress Notes (Signed)
Pt feels some better from iron tx.

## 2020-09-02 NOTE — Addendum Note (Signed)
Addended by: Randa Evens C on: 09/02/2020 03:41 PM   Modules accepted: Orders

## 2020-09-02 NOTE — Progress Notes (Signed)
I connected with Jenny Griffin on 09/02/20 at  2:30 PM EDT by video enabled telemedicine visit and verified that I am speaking with the correct person using two identifiers.   I discussed the limitations, risks, security and privacy concerns of performing an evaluation and management service by telemedicine and the availability of in-person appointments. I also discussed with the patient that there may be a patient responsible charge related to this service. The patient expressed understanding and agreed to proceed.  Other persons participating in the visit and their role in the encounter:  none  Patient's location:  home Provider's location:  work  Risk analyst Complaint: Routine follow-up of iron deficiency anemia  History of present illness: patient is a 49 year old female with a past medical history significant for anxiety, GERD and pernicious anemia. She was previously seen [facility] the [facility] 2017 by Dr. Lindi Adie. She has been using B12 nasal spray once every week. However patient reports she has been having symptoms of worsening depression shortness of breath as well as cognitive difficulties last B12 level checked in February 2020 was 672.Most recent CBC from 06/27/2020 showed white count of 6.1, H&H of 12.4/38.2 with an MCV of 84 and a platelet count of 250.  Patient reports that currently she has difficulty in word finding at times and something else comes out of her mouth when she is meantsomething completely different.Appetite and weight is currently stable. She continues to be active and independent of her ADLs and IADLs. Reports tingling numbness in her bilateral hands and occasionally pain in her bilateral feet. Neuropathy sometimes causes her to lose grip of objects  Results of blood work from 07/07/2020 showed white count of 6.5, H&H of 12.7/37.3 and a platelet count of 293.  Iron study showed a low iron saturation of 7% and elevated TIBC of 463.  Ferritin levels were  low at 9.  B12 levels were normal at 350.  TSH was normal.  Reticulocyte count was normal at 2.1%.  CMP was unremarkable.  Folate levels were normal.  Myeloma panel showed no M protein.  Polyclonal increase in immunoglobulins.  Serum free light chain ratio was normal.   Interval history: Patient reports that after she has received IV iron she felt much better with improvement in her energy levels.  She was back to doing her 3 mile walks and able to think and function better.   Review of Systems  Constitutional: Negative for chills, fever, malaise/fatigue and weight loss.  HENT: Negative for congestion, ear discharge and nosebleeds.   Eyes: Negative for blurred vision.  Respiratory: Negative for cough, hemoptysis, sputum production, shortness of breath and wheezing.   Cardiovascular: Negative for chest pain, palpitations, orthopnea and claudication.  Gastrointestinal: Negative for abdominal pain, blood in stool, constipation, diarrhea, heartburn, melena, nausea and vomiting.  Genitourinary: Negative for dysuria, flank pain, frequency, hematuria and urgency.  Musculoskeletal: Negative for back pain, joint pain and myalgias.  Skin: Negative for rash.  Neurological: Negative for dizziness, tingling, focal weakness, seizures, weakness and headaches.  Endo/Heme/Allergies: Does not bruise/bleed easily.  Psychiatric/Behavioral: Negative for depression and suicidal ideas. The patient does not have insomnia.     Allergies  Allergen Reactions  . Contrast Media [Iodinated Diagnostic Agents] Other (See Comments)    Severe abdominal pain    Past Medical History:  Diagnosis Date  . Anemia   . Anxiety   . Complication of anesthesia   . GERD (gastroesophageal reflux disease)    RARE-NO MEDS  . Headache  H/O MIGRAINES  . Panic attacks january 2013  . PONV (postoperative nausea and vomiting)     Past Surgical History:  Procedure Laterality Date  . CESAREAN SECTION    . CYSTOSCOPY   10/02/2018   Procedure: CYSTOSCOPY;  Surgeon: Harlin Heys, MD;  Location: [facility];  Service: Gynecology;;  . LAPAROSCOPIC VAGINAL HYSTERECTOMY WITH SALPINGO OOPHORECTOMY Left 10/02/2018   Procedure: LAPAROSCOPIC ASSISTED VAGINAL HYSTERECTOMY WITH LEFT SALPINGO OOPHORECTOMY;  Surgeon: Harlin Heys, MD;  Location: [facility];  Service: Gynecology;  Laterality: Left;  . RIGHT OOPHORECTOMY      Social History   Socioeconomic History  . Marital status: Single    Spouse name: Not on file  . Number of children: Not on file  . Years of education: Not on file  . Highest education level: Not on file  Occupational History    Employer: [employer]  Tobacco Use  . Smoking status: Never Smoker  . Smokeless tobacco: Never Used  Vaping Use  . Vaping Use: Never assessed  Substance and Sexual Activity  . Alcohol use: No  . Drug use: No  . Sexual activity: Yes  Other Topics Concern  . Not on file  Social History Narrative  . Not on file   Social Determinants of Health   Financial Resource Strain:   . Difficulty of Paying Living Expenses: Not on file  Food Insecurity:   . Worried About Charity fundraiser in the Last Year: Not on file  . Ran Out of Food in the Last Year: Not on file  Transportation Needs:   . Lack of Transportation (Medical): Not on file  . Lack of Transportation (Non-Medical): Not on file  Physical Activity:   . Days of Exercise per Week: Not on file  . Minutes of Exercise per Session: Not on file  Stress:   . Feeling of Stress : Not on file  Social Connections:   . Frequency of Communication with Friends and Family: Not on file  . Frequency of Social Gatherings with Friends and Family: Not on file  . Attends Religious Services: Not on file  . Active Member of Clubs or Organizations: Not on file  . Attends Archivist Meetings: Not on file  . Marital Status: Not on file  Intimate Partner Violence:   . Fear of Current or Ex-Partner:  Not on file  . Emotionally Abused: Not on file  . Physically Abused: Not on file  . Sexually Abused: Not on file    History reviewed. No pertinent family history.   Current Outpatient Medications:  .  Cyanocobalamin (NASCOBAL) 500 MCG/0.1ML SOLN, Place 500 mcg into the nose every Friday. , Disp: , Rfl:  .  PREMARIN 0.625 MG tablet, TAKE 1 TABLET BY MOUTH DAILY (Patient taking differently: Take 0.625 mg by mouth at bedtime. ), Disp: 90 tablet, Rfl: 3  No results found.  No images are attached to the encounter.   CMP Latest Ref Rng & Units 07/07/2020  Glucose 70 - 99 mg/dL 98  BUN 6 - 20 mg/dL 10  Creatinine 0.44 - 1.00 mg/dL 0.62  Sodium 135 - 145 mmol/L 139  Potassium 3.5 - 5.1 mmol/L 3.9  Chloride 98 - 111 mmol/L 105  CO2 22 - 32 mmol/L 27  Calcium 8.9 - 10.3 mg/dL 8.8(L)  Total Protein 6.5 - 8.1 g/dL 8.2(H)  Total Bilirubin 0.3 - 1.2 mg/dL 0.4  Alkaline Phos 38 - 126 U/L 69  AST 15 - 41 U/L 12(L)  ALT 0 - 44 U/L 15   CBC Latest Ref Rng & Units 08/27/2020  WBC 4.0 - 10.5 K/uL 5.4  Hemoglobin 12.0 - 15.0 g/dL 13.3  Hematocrit 36 - 46 % 37.3  Platelets 150 - 400 K/uL 235     Observation/objective: Appears in no acute distress over video visit today.  Breathing is nonlabored  Assessment and plan: Patient is a 49 year old female with history of iron deficiency anemia and this is a routine follow-up visit  Patient recently received 2 doses of Venofer in early September 2021.  Her iron studies are now improved with a ferritin of 254 from a prior value of 9.  She was not anemic to begin with and her hemoglobin has mildly improved from 12.7-13.3.  She does not require any IV iron at this time.  Repeat CBC ferritin and iron studies in 3 in 6 months and I will see her back in 6 months  Follow-up instructions: As above  I discussed the assessment and treatment plan with the patient. The patient was provided an opportunity to ask questions and all were answered. The patient  agreed with the plan and demonstrated an understanding of the instructions.   The patient was advised to call back or seek an in-person evaluation if the symptoms worsen or if the condition fails to improve as anticipated.   Visit Diagnosis: 1. Iron deficiency anemia, unspecified iron deficiency anemia type     Dr. Randa Evens, MD, MPH [facility] Tel- [phone] 09/02/2020 3:37 PM

## 2020-10-01 ENCOUNTER — Telehealth: Payer: Self-pay | Admitting: Gastroenterology

## 2020-10-01 ENCOUNTER — Ambulatory Visit: Payer: BC Managed Care – PPO | Admitting: Gastroenterology

## 2020-11-24 ENCOUNTER — Ambulatory Visit: Payer: BC Managed Care – PPO | Admitting: Gastroenterology

## 2020-11-28 ENCOUNTER — Inpatient Hospital Stay: Payer: 59 | Attending: Oncology

## 2021-01-29 ENCOUNTER — Other Ambulatory Visit: Payer: Self-pay | Admitting: Obstetrics and Gynecology

## 2021-01-29 DIAGNOSIS — Z9889 Other specified postprocedural states: Secondary | ICD-10-CM

## 2021-02-10 ENCOUNTER — Encounter: Payer: Self-pay | Admitting: Obstetrics and Gynecology

## 2021-02-11 ENCOUNTER — Encounter: Payer: Self-pay | Admitting: Obstetrics and Gynecology

## 2021-02-16 ENCOUNTER — Encounter: Payer: Self-pay | Admitting: Obstetrics and Gynecology

## 2021-02-16 ENCOUNTER — Telehealth: Payer: Self-pay | Admitting: Obstetrics and Gynecology

## 2021-02-16 NOTE — Telephone Encounter (Signed)
Patient called today inquiring about hormone pellets and if this is somehting Dr Amalia Hailey does. Pt is scheduled for this Thursday. Please Advise.

## 2021-02-17 ENCOUNTER — Encounter: Payer: Self-pay | Admitting: Obstetrics and Gynecology

## 2021-02-19 ENCOUNTER — Encounter: Payer: Self-pay | Admitting: Obstetrics and Gynecology

## 2021-02-23 NOTE — Telephone Encounter (Signed)
LM for patient to return call.

## 2021-02-24 ENCOUNTER — Other Ambulatory Visit: Payer: Self-pay

## 2021-02-24 ENCOUNTER — Encounter: Payer: Self-pay | Admitting: Obstetrics and Gynecology

## 2021-02-24 ENCOUNTER — Ambulatory Visit (INDEPENDENT_AMBULATORY_CARE_PROVIDER_SITE_OTHER): Payer: 59 | Admitting: Obstetrics and Gynecology

## 2021-02-24 VITALS — BP 107/75 | HR 84 | Ht 59.0 in | Wt 180.0 lb

## 2021-02-24 DIAGNOSIS — L659 Nonscarring hair loss, unspecified: Secondary | ICD-10-CM

## 2021-02-24 DIAGNOSIS — Z79890 Hormone replacement therapy (postmenopausal): Secondary | ICD-10-CM

## 2021-02-24 DIAGNOSIS — R6882 Decreased libido: Secondary | ICD-10-CM

## 2021-02-24 MED ORDER — ESTRADIOL 1 MG PO TABS: 1.0000 mg | ORAL_TABLET | Freq: Every day | ORAL | 3 refills | Status: AC

## 2021-02-24 NOTE — Progress Notes (Signed)
HPI:      [name] is a 50 y.o. G4P0003 who LMP was Patient's last menstrual period was 09/24/2018.  Subjective:   She presents today to discuss several issues.  She continues to take Premarin daily.  She denies hot flashes vaginal dryness or other signs and symptoms of menopause while taking ERT.  She does state that Premarin has become very expensive and not covered by her insurance she would like to change to a different medication if possible. She complains of a decreased sexual desire.  She does not want to try medications for this at this time. She complains of hair loss. She has a component of inability to lose weight despite the same diet and exercise that she has had in the past. Complains of short-term memory loss.    Hx: The following portions of the patient's history were reviewed and updated as appropriate:             She  has a past medical history of Anemia, Anxiety, Complication of anesthesia, GERD (gastroesophageal reflux disease), Headache, Panic attacks (january 2013), and PONV (postoperative nausea and vomiting). She does not have any pertinent problems on file. She  has a past surgical history that includes Cesarean section; Right oophorectomy; Laparoscopic vaginal hysterectomy with salpingo oophorectomy (Left, 10/02/2018); and Cystoscopy (10/02/2018). Her family history is not on file. She  reports that she has never smoked. She has never used smokeless tobacco. She reports that she does not drink alcohol and does not use drugs. She has a current medication list which includes the following prescription(s): nascobal, estradiol, and ondansetron. She is allergic to contrast media [iodinated diagnostic agents].       Review of Systems:  Review of Systems  Constitutional: Denied constitutional symptoms, night sweats, recent illness, fatigue, fever, insomnia and weight loss.  Eyes: Denied eye symptoms, eye pain, photophobia, vision change and  visual disturbance.  Ears/Nose/Throat/Neck: Denied ear, nose, throat or neck symptoms, hearing loss, nasal discharge, sinus congestion and sore throat.  Cardiovascular: Denied cardiovascular symptoms, arrhythmia, chest pain/pressure, edema, exercise intolerance, orthopnea and palpitations.  Respiratory: Denied pulmonary symptoms, asthma, pleuritic pain, productive sputum, cough, dyspnea and wheezing.  Gastrointestinal: Denied, gastro-esophageal reflux, melena, nausea and vomiting.  Genitourinary: Denied genitourinary symptoms including symptomatic vaginal discharge, pelvic relaxation issues, and urinary complaints.  Musculoskeletal: Denied musculoskeletal symptoms, stiffness, swelling, muscle weakness and myalgia.  Dermatologic: Denied dermatology symptoms, rash and scar.  Neurologic: Denied neurology symptoms, dizziness, headache, neck pain and syncope.  Psychiatric: Denied psychiatric symptoms, anxiety and depression.  Endocrine: Denied endocrine symptoms including hot flashes and night sweats.   Meds:   Current Outpatient Medications on File Prior to Visit  Medication Sig Dispense Refill  . Cyanocobalamin (NASCOBAL) 500 MCG/0.1ML SOLN Place 500 mcg into the nose every Friday.     . ondansetron (ZOFRAN-ODT) 4 MG disintegrating tablet Take 4 mg by mouth every 8 (eight) hours as needed.     No current facility-administered medications on file prior to visit.          Objective:     Vitals:   02/24/21 1523  BP: 107/75  Pulse: 84   Filed Weights   02/24/21 1523  Weight: 180 lb (81.6 kg)                Assessment:    G4P0003 Patient Active Problem List   Diagnosis Date Noted  . Fibroids, submucosal 10/02/2018  . Chest pain 11/12/2017  . Nausea alone 05/02/2012  .  Pernicious anemia 09/16/2011     1. Postmenopausal hormone therapy   2. Hair loss   3. Decreased sexual interest     Even though many of her symptoms noted in the subjective area above occasionally have a  hormonal component, with her taking daily Premarin I strongly doubt that these are simple function of estrogen deficiency.  She would like to continue estrogen replacement therapy.   Plan:            1.  We have discussed all the above issues.  She is decided to run them by her family physician for further thoughts and possible testing.  2.  She is somewhat delinquent in getting a mammogram and general blood work to rule out thyroid issues, lipid disorder etc.  She plans to get this testing through her family physician.  She is very reluctant regarding mammography.  We have also discussed the necessity of colonoscopy in the near future and she is reluctant to get this as well.  3.  Some strategies regarding sexual desire have been discussed and she will try these before exploring the option of medication. Orders No orders of the defined types were placed in this encounter.    Meds ordered this encounter  Medications  . estradiol (ESTRACE) 1 MG tablet    Sig: Take 1 tablet (1 mg total) by mouth daily.    Dispense:  90 tablet    Refill:  3      F/U  Return in about 1 year (around 02/24/2022). I spent 33 minutes involved in the care of this patient preparing to see the patient by obtaining and reviewing her medical history (including labs, imaging tests and prior procedures), documenting clinical information in the electronic health record (EHR), counseling and coordinating care plans, writing and sending prescriptions, ordering tests or procedures and directly communicating with the patient by discussing pertinent items from her history and physical exam as well as detailing my assessment and plan as noted above so that she has an informed understanding.  All of her questions were answered.  Jenny Griffin, M.D. 02/24/2021 4:02 PM

## 2021-02-27 ENCOUNTER — Inpatient Hospital Stay (HOSPITAL_BASED_OUTPATIENT_CLINIC_OR_DEPARTMENT_OTHER): Payer: BLUE CROSS/BLUE SHIELD | Admitting: Oncology

## 2021-02-27 ENCOUNTER — Inpatient Hospital Stay: Payer: BLUE CROSS/BLUE SHIELD | Attending: Oncology

## 2021-02-27 ENCOUNTER — Other Ambulatory Visit: Payer: Self-pay

## 2021-02-27 DIAGNOSIS — D508 Other iron deficiency anemias: Secondary | ICD-10-CM | POA: Insufficient documentation

## 2021-02-27 DIAGNOSIS — D509 Iron deficiency anemia, unspecified: Secondary | ICD-10-CM | POA: Diagnosis not present

## 2021-02-27 LAB — IRON AND TIBC
Iron: 68 ug/dL (ref 28–170)
Saturation Ratios: 23 % (ref 10.4–31.8)
TIBC: 294 ug/dL (ref 250–450)
UIBC: 226 ug/dL

## 2021-02-27 LAB — CBC WITH DIFFERENTIAL/PLATELET
Abs Immature Granulocytes: 0.03 10*3/uL (ref 0.00–0.07)
Basophils Absolute: 0.1 10*3/uL (ref 0.0–0.1)
Basophils Relative: 1 %
Eosinophils Absolute: 0.2 10*3/uL (ref 0.0–0.5)
Eosinophils Relative: 2 %
HCT: 39.4 % (ref 36.0–46.0)
Hemoglobin: 13.9 g/dL (ref 12.0–15.0)
Immature Granulocytes: 0 %
Lymphocytes Relative: 27 %
Lymphs Abs: 1.9 10*3/uL (ref 0.7–4.0)
MCH: 30.9 pg (ref 26.0–34.0)
MCHC: 35.3 g/dL (ref 30.0–36.0)
MCV: 87.6 fL (ref 80.0–100.0)
Monocytes Absolute: 0.5 10*3/uL (ref 0.1–1.0)
Monocytes Relative: 8 %
Neutro Abs: 4.4 10*3/uL (ref 1.7–7.7)
Neutrophils Relative %: 62 %
Platelets: 272 10*3/uL (ref 150–400)
RBC: 4.5 MIL/uL (ref 3.87–5.11)
RDW: 11.6 % (ref 11.5–15.5)
WBC: 7.1 10*3/uL (ref 4.0–10.5)
nRBC: 0 % (ref 0.0–0.2)

## 2021-02-27 LAB — FERRITIN: Ferritin: 167 ng/mL (ref 11–307)

## 2021-02-27 NOTE — Progress Notes (Signed)
I connected with Jenny Griffin on 02/27/21 at  2:15 PM EDT by video enabled telemedicine visit and verified that I am speaking with the correct person using two identifiers.   I discussed the limitations, risks, security and privacy concerns of performing an evaluation and management service by telemedicine and the availability of in-person appointments. I also discussed with the patient that there may be a patient responsible charge related to this service. The patient expressed understanding and agreed to proceed.  Other persons participating in the visit and their role in the encounter:  none  Patient's location:  home Provider's location:  work  Risk analyst Complaint: Routine follow-up of iron deficiency anemia  History of present illness: patient is a 50 year old female with a past medical history significant for anxiety, GERD and pernicious anemia. She was previously seen at the cancer center 2017 by Dr. Lindi Adie. She has been using B12 nasal spray once every week. However patient reports she has been having symptoms of worsening depression shortness of breath as well as cognitive difficulties last B12 level checked in February 2020 was 672.Most recent CBC from 06/27/2020 showed white count of 6.1, H&H of 12.4/38.2 with an MCV of 84 and a platelet count of 250.  Patient reports that currently she has difficulty in word finding at times and something else comes out of her mouth when she is meantsomething completely different.Appetite and weight is currently stable. She continues to be active and independent of her ADLs and IADLs. Reports tingling numbness in her bilateral hands and occasionally pain in her bilateral feet. Neuropathy sometimes causes her to lose grip of objects  Results of blood work from 07/07/2020 showed white count of 6.5, H&H of 12.7/37.3 and a platelet count of 293. Iron study showed a low iron saturation of 7% and elevated TIBC of 463. Ferritin levels were  low at 9. B12 levels were normal at 350. TSH was normal. Reticulocyte count was normal at 2.1%. CMP was unremarkable. Folate levels were normal. Myeloma panel showed no M protein. Polyclonal increase in immunoglobulins. Serum free light chain ratio was normal.   Interval history: Patient continues to report word finding difficulty in her day-to-day life.  She states that symptoms had gotten better when she received IV iron but she feels like she is back to square 1.  She also has episodes when her entire body aches after performing ADLs.   Review of Systems  Constitutional: Positive for malaise/fatigue. Negative for chills, fever and weight loss.  HENT: Negative for congestion, ear discharge and nosebleeds.   Eyes: Negative for blurred vision.  Respiratory: Negative for cough, hemoptysis, sputum production, shortness of breath and wheezing.   Cardiovascular: Negative for chest pain, palpitations, orthopnea and claudication.  Gastrointestinal: Negative for abdominal pain, blood in stool, constipation, diarrhea, heartburn, melena, nausea and vomiting.  Genitourinary: Negative for dysuria, flank pain, frequency, hematuria and urgency.  Musculoskeletal: Negative for back pain, joint pain and myalgias.  Skin: Negative for rash.  Neurological: Negative for dizziness, tingling, focal weakness, seizures, weakness and headaches.       Word finding difficulty  Endo/Heme/Allergies: Does not bruise/bleed easily.  Psychiatric/Behavioral: Negative for depression and suicidal ideas. The patient does not have insomnia.     Allergies  Allergen Reactions  . Contrast Media [Iodinated Diagnostic Agents] Other (See Comments)    Severe abdominal pain    Past Medical History:  Diagnosis Date  . Anemia   . Anxiety   . Complication of anesthesia   . GERD (  gastroesophageal reflux disease)    RARE-NO MEDS  . Headache    H/O MIGRAINES  . Panic attacks january 2013  . PONV (postoperative nausea and  vomiting)     Past Surgical History:  Procedure Laterality Date  . CESAREAN SECTION    . CYSTOSCOPY  10/02/2018   Procedure: CYSTOSCOPY;  Surgeon: Harlin Heys, MD;  Location: [facility];  Service: Gynecology;;  . LAPAROSCOPIC VAGINAL HYSTERECTOMY WITH SALPINGO OOPHORECTOMY Left 10/02/2018   Procedure: LAPAROSCOPIC ASSISTED VAGINAL HYSTERECTOMY WITH LEFT SALPINGO OOPHORECTOMY;  Surgeon: Harlin Heys, MD;  Location: [facility];  Service: Gynecology;  Laterality: Left;  . RIGHT OOPHORECTOMY      Social History   Socioeconomic History  . Marital status: Single    Spouse name: Not on file  . Number of children: Not on file  . Years of education: Not on file  . Highest education level: Not on file  Occupational History    Employer: [employer]  Tobacco Use  . Smoking status: Never Smoker  . Smokeless tobacco: Never Used  Vaping Use  . Vaping Use: Not on file  Substance and Sexual Activity  . Alcohol use: No  . Drug use: No  . Sexual activity: Yes  Other Topics Concern  . Not on file  Social History Narrative  . Not on file   Social Determinants of Health   Financial Resource Strain: Not on file  Food Insecurity: Not on file  Transportation Needs: Not on file  Physical Activity: Not on file  Stress: Not on file  Social Connections: Not on file  Intimate Partner Violence: Not on file    No family history on file.   Current Outpatient Medications:  .  Cyanocobalamin (NASCOBAL) 500 MCG/0.1ML SOLN, Place 500 mcg into the nose every Friday. , Disp: , Rfl:  .  estradiol (ESTRACE) 1 MG tablet, Take 1 tablet (1 mg total) by mouth daily., Disp: 90 tablet, Rfl: 3 .  ondansetron (ZOFRAN-ODT) 4 MG disintegrating tablet, Take 4 mg by mouth every 8 (eight) hours as needed., Disp: , Rfl:   No results found.  No images are attached to the encounter.   CMP Latest Ref Rng & Units 07/07/2020  Glucose 70 - 99 mg/dL 98  BUN 6 - 20 mg/dL 10  Creatinine 0.44 - 1.00  mg/dL 0.62  Sodium 135 - 145 mmol/L 139  Potassium 3.5 - 5.1 mmol/L 3.9  Chloride 98 - 111 mmol/L 105  CO2 22 - 32 mmol/L 27  Calcium 8.9 - 10.3 mg/dL 8.8(L)  Total Protein 6.5 - 8.1 g/dL 8.2(H)  Total Bilirubin 0.3 - 1.2 mg/dL 0.4  Alkaline Phos 38 - 126 U/L 69  AST 15 - 41 U/L 12(L)  ALT 0 - 44 U/L 15   CBC Latest Ref Rng & Units 02/27/2021  WBC 4.0 - 10.5 K/uL 7.1  Hemoglobin 12.0 - 15.0 g/dL 13.9  Hematocrit 36.0 - 46.0 % 39.4  Platelets 150 - 400 K/uL 272     Observation/objective: Appears in no acute distress over video visit today.  Breathing is nonlabored   Assessment and plan: Patient is a 50 year old female with history of iron deficiency anemia here for routine follow-up  Patient is not Anemic presently with a hemoglobin of 13.9.  Ferritin levels are normal at 167 with normal iron studies.  She is therefore not iron deficient at this time and does not require IV iron.  Repeat CBC ferritin and iron studies in 3 in 6 months and  I will see her in 6 months  Word finding difficulty: Etiology unclear.  Back in August 2021 when she was having these ongoing symptoms she has had CT head as well as MRI and MRA which did not show any acute pathology.  I will refer her to neurology for further evaluation.  Follow-up instructions: As above  I discussed the assessment and treatment plan with the patient. The patient was provided an opportunity to ask questions and all were answered. The patient agreed with the plan and demonstrated an understanding of the instructions.   The patient was advised to call back or seek an in-person evaluation if the symptoms worsen or if the condition fails to improve as anticipated.    Visit Diagnosis: 1. Iron deficiency anemia, unspecified iron deficiency anemia type     Dr. Randa Evens, MD, MPH [facility] Tel- [phone] 02/27/2021 3:32 PM

## 2021-03-09 ENCOUNTER — Telehealth: Payer: Self-pay | Admitting: Obstetrics and Gynecology

## 2021-03-09 NOTE — Telephone Encounter (Signed)
New Message:  Pt states that she started taking estradiol and stated she having some side effects that is not normal(Temper and mood swings) she wishes to go back on the other medication she was taking before this and she will pay for it.

## 2021-03-09 NOTE — Telephone Encounter (Signed)
Please advise. I have already sent you the my chart message on this.

## 2021-03-11 MED ORDER — ESTROGENS CONJUGATED 0.625 MG PO TABS: 0.6250 mg | ORAL_TABLET | Freq: Every day | ORAL | 3 refills | Status: AC

## 2021-03-11 NOTE — Telephone Encounter (Signed)
Premarin sent to pharmacy. I left message on patient voicemail that this has been sent in.

## 2021-03-17 ENCOUNTER — Telehealth: Payer: Self-pay | Admitting: Obstetrics and Gynecology

## 2021-03-17 NOTE — Telephone Encounter (Signed)
New Message:  Pt states she called last Tuesday and requested a refill of Permarin and she has been to the Pharmacy multiple times and was told that they have not received it.  Pt uses [facility] at [address]

## 2021-03-17 NOTE — Telephone Encounter (Signed)
Spoke with patient and let her know that it is calling for a PA. Patient stated that the pharmacy did not have the RX.

## 2021-03-18 NOTE — Telephone Encounter (Signed)
LM for patient to send copies of both of her insurance cards through my chart so I can do her PA. I do not have copy of cards on file. Cover my meds is requiring the RX bin number and this in not in the chart. I can not move forward until I have the copies. I have old cards on file but one is for [place] and she no longer has [place]. [employer] says that he does not need a PA.

## 2021-04-03 NOTE — Telephone Encounter (Signed)
Patient did not return call.

## 2021-04-13 ENCOUNTER — Other Ambulatory Visit: Payer: Self-pay | Admitting: Family Medicine

## 2021-04-13 DIAGNOSIS — Z1231 Encounter for screening mammogram for malignant neoplasm of breast: Secondary | ICD-10-CM

## 2021-04-16 ENCOUNTER — Encounter: Payer: Self-pay | Admitting: Oncology

## 2021-04-30 ENCOUNTER — Encounter: Payer: Self-pay | Admitting: Oncology

## 2021-05-04 ENCOUNTER — Encounter: Payer: Self-pay | Admitting: Oncology

## 2021-05-04 ENCOUNTER — Other Ambulatory Visit: Payer: Self-pay

## 2021-05-04 ENCOUNTER — Ambulatory Visit
Admission: RE | Admit: 2021-05-04 | Discharge: 2021-05-04 | Disposition: A | Discharge status: Home / Self Care | Payer: BLUE CROSS/BLUE SHIELD | Source: Ambulatory Visit | Attending: Family Medicine | Admitting: Family Medicine

## 2021-05-04 DIAGNOSIS — Z1231 Encounter for screening mammogram for malignant neoplasm of breast: Secondary | ICD-10-CM | POA: Insufficient documentation

## 2021-05-05 ENCOUNTER — Encounter: Payer: Self-pay | Admitting: Oncology

## 2021-05-21 ENCOUNTER — Encounter: Payer: Self-pay | Admitting: Oncology

## 2021-05-29 ENCOUNTER — Inpatient Hospital Stay: Payer: BLUE CROSS/BLUE SHIELD | Attending: Oncology

## 2021-06-26 IMAGING — MG MM DIGITAL SCREENING BILAT W/ TOMO AND CAD
6 of 12 series · 6 of 36 positions shown · non-contrast
Comparison: None.

CLINICAL DATA: Screening.

EXAM:
DIGITAL SCREENING BILATERAL MAMMOGRAM WITH TOMOSYNTHESIS AND CAD
TECHNIQUE: Bilateral screening digital craniocaudal and mediolateral oblique
mammograms were obtained. Bilateral screening digital breast
tomosynthesis was performed. The images were evaluated with
computer-aided detection.

[R MLO synth-2D (1 of 2)]
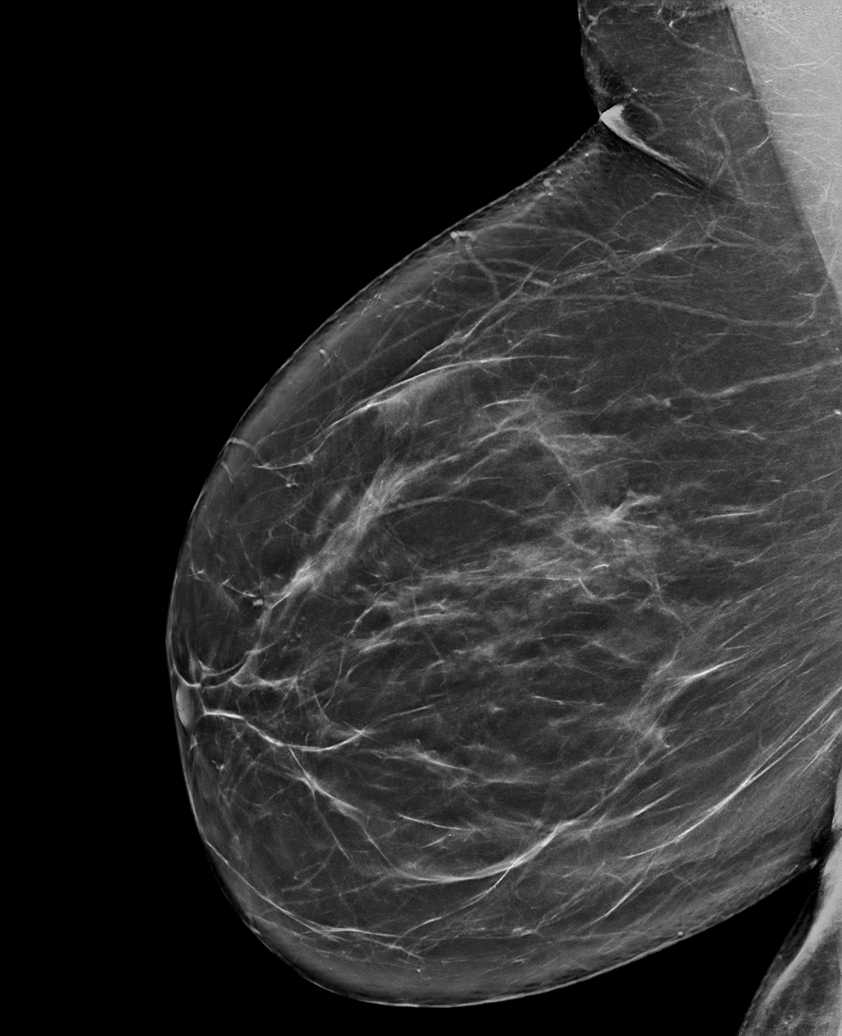

[L CC synth-2D]
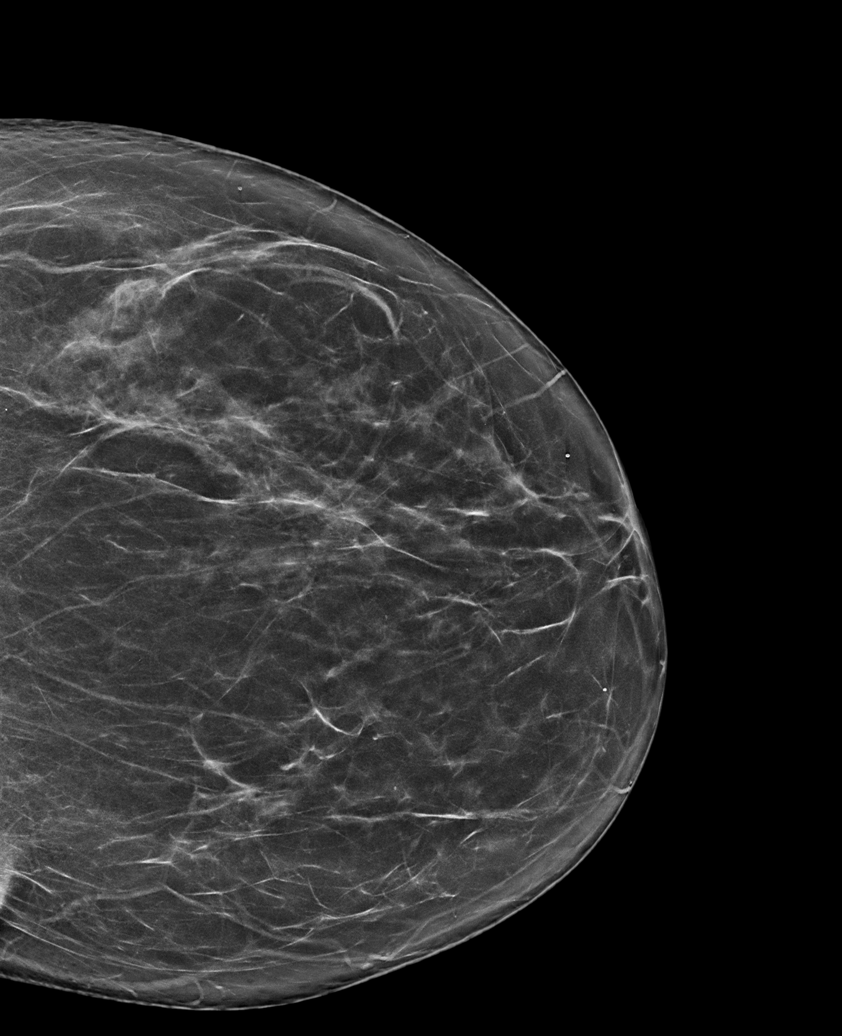

[R MLO synth-2D (2 of 2)]
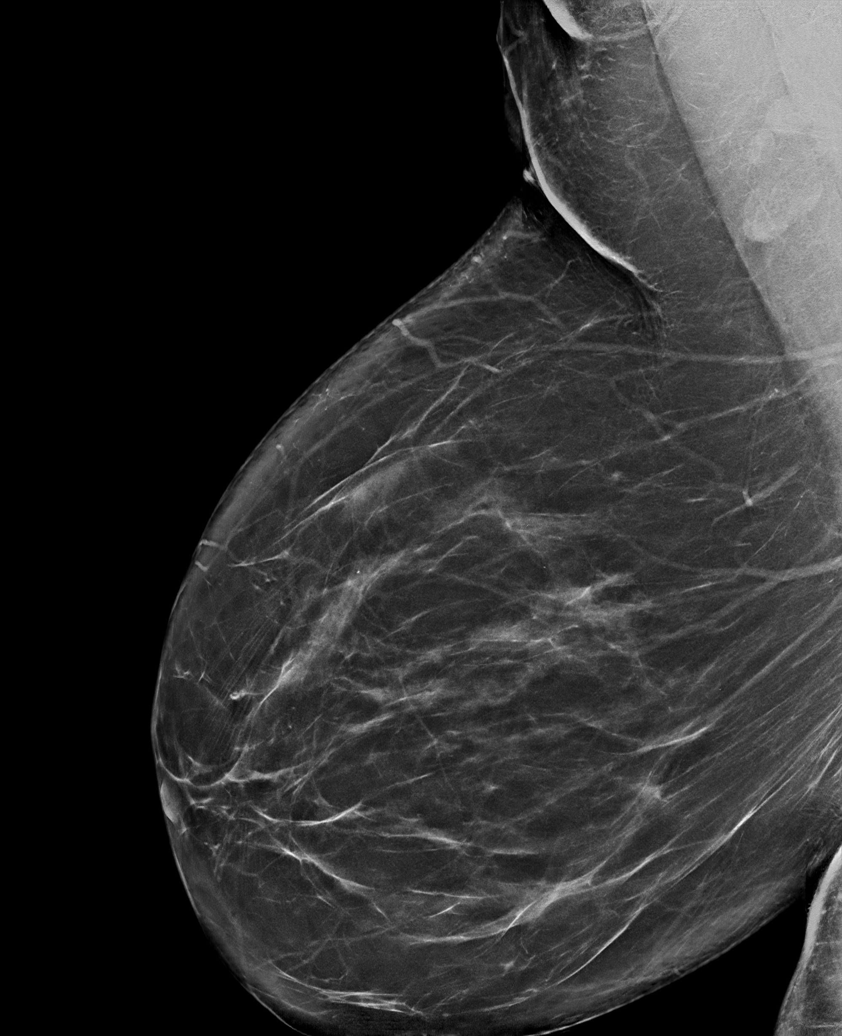

[L MLO synth-2D (1 of 2)]
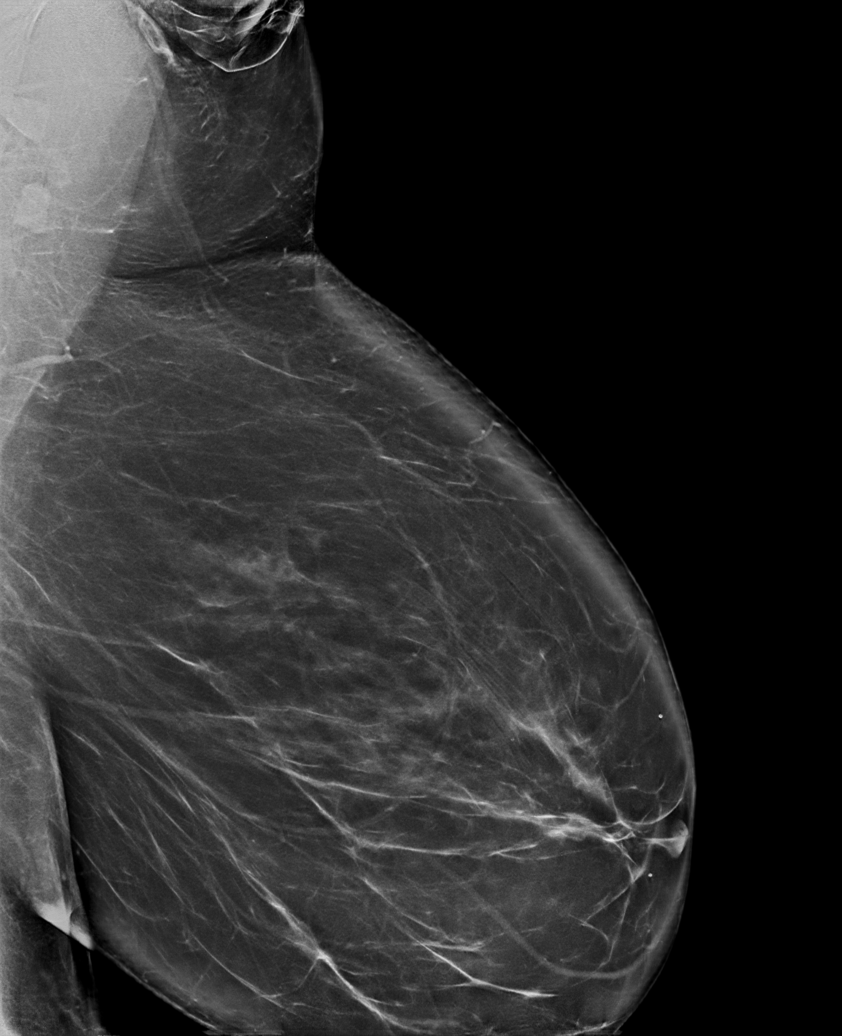

[L MLO synth-2D (2 of 2)]
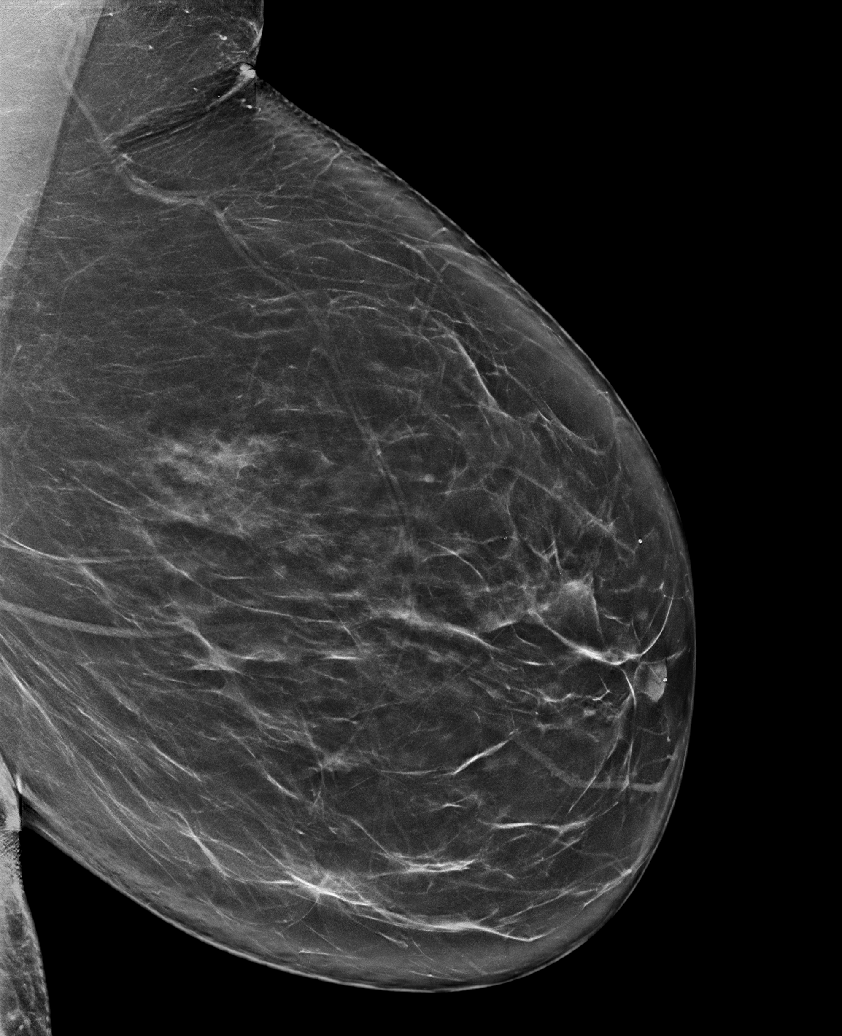

[R CC synth-2D]
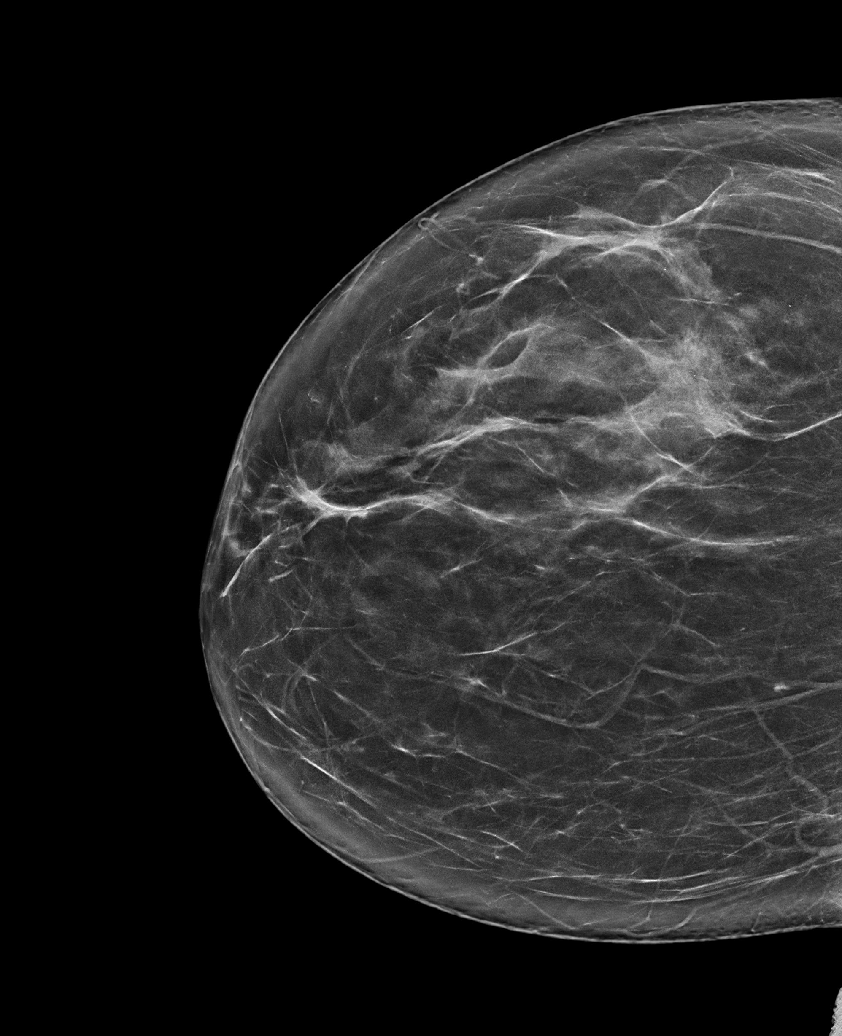

[6 of 36 positions shown; findings below may reference images not displayed]

ACR Breast Density Category b: There are scattered areas of
fibroglandular density.
FINDINGS: There are no findings suspicious for malignancy. The images were
evaluated with computer-aided detection.
IMPRESSION: No mammographic evidence of malignancy. A result letter of this
screening mammogram will be mailed directly to the patient.

RECOMMENDATION:
Screening mammogram in one year. (Code:C7-6-ASJ)

BI-RADS CATEGORY  1: Negative.

## 2021-08-31 ENCOUNTER — Inpatient Hospital Stay: Payer: BLUE CROSS/BLUE SHIELD | Admitting: Oncology

## 2021-08-31 ENCOUNTER — Inpatient Hospital Stay: Payer: BLUE CROSS/BLUE SHIELD | Attending: Oncology
